# Patient Record
Sex: Male | Born: 1995 | Race: White | Hispanic: No | Marital: Single | Smoking: Current every day smoker
Health system: Southern US, Community
[De-identification: ages and names within clinical notes are randomized; demographics above are authoritative.]

## PROBLEM LIST (undated history)

## (undated) DIAGNOSIS — R011 Cardiac murmur, unspecified: Secondary | ICD-10-CM

## (undated) DIAGNOSIS — I10 Essential (primary) hypertension: Secondary | ICD-10-CM

## (undated) DIAGNOSIS — F419 Anxiety disorder, unspecified: Secondary | ICD-10-CM

## (undated) DIAGNOSIS — I38 Endocarditis, valve unspecified: Secondary | ICD-10-CM

## (undated) HISTORY — PX: LEG SURGERY: SHX1003

---
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
## 2016-06-07 ENCOUNTER — Emergency Department (HOSPITAL_COMMUNITY)
Admission: EM | Admit: 2016-06-07 | Discharge: 2016-06-07 | Disposition: A | Discharge status: Home / Self Care | Payer: Self-pay | Attending: Emergency Medicine | Admitting: Emergency Medicine

## 2016-06-07 ENCOUNTER — Encounter (HOSPITAL_COMMUNITY): Payer: Self-pay | Admitting: *Deleted

## 2016-06-07 DIAGNOSIS — IMO0002 Reserved for concepts with insufficient information to code with codable children: Secondary | ICD-10-CM

## 2016-06-07 DIAGNOSIS — Y939 Activity, unspecified: Secondary | ICD-10-CM | POA: Insufficient documentation

## 2016-06-07 DIAGNOSIS — F17210 Nicotine dependence, cigarettes, uncomplicated: Secondary | ICD-10-CM | POA: Insufficient documentation

## 2016-06-07 DIAGNOSIS — W268XXA Contact with other sharp object(s), not elsewhere classified, initial encounter: Secondary | ICD-10-CM | POA: Insufficient documentation

## 2016-06-07 DIAGNOSIS — S51812A Laceration without foreign body of left forearm, initial encounter: Secondary | ICD-10-CM | POA: Insufficient documentation

## 2016-06-07 DIAGNOSIS — Y929 Unspecified place or not applicable: Secondary | ICD-10-CM | POA: Insufficient documentation

## 2016-06-07 DIAGNOSIS — Y999 Unspecified external cause status: Secondary | ICD-10-CM | POA: Insufficient documentation

## 2016-06-07 DIAGNOSIS — F1290 Cannabis use, unspecified, uncomplicated: Secondary | ICD-10-CM

## 2016-06-07 DIAGNOSIS — X838XXA Intentional self-harm by other specified means, initial encounter: Secondary | ICD-10-CM

## 2016-06-07 LAB — CBC WITH DIFFERENTIAL/PLATELET
BASOS ABS: 0 10*3/uL (ref 0.0–0.1)
BASOS PCT: 0 %
EOS PCT: 1 %
Eosinophils Absolute: 0.1 10*3/uL (ref 0.0–0.7)
HCT: 41 % (ref 39.0–52.0)
Hemoglobin: 14.9 g/dL (ref 13.0–17.0)
Lymphocytes Relative: 31 %
Lymphs Abs: 2.7 10*3/uL (ref 0.7–4.0)
MCH: 29.3 pg (ref 26.0–34.0)
MCHC: 36.3 g/dL — ABNORMAL HIGH (ref 30.0–36.0)
MCV: 80.7 fL (ref 78.0–100.0)
MONO ABS: 0.5 10*3/uL (ref 0.1–1.0)
Monocytes Relative: 6 %
NEUTROS ABS: 5.4 10*3/uL (ref 1.7–7.7)
Neutrophils Relative %: 62 %
PLATELETS: 246 10*3/uL (ref 150–400)
RBC: 5.08 MIL/uL (ref 4.22–5.81)
RDW: 11.7 % (ref 11.5–15.5)
WBC: 8.7 10*3/uL (ref 4.0–10.5)

## 2016-06-07 LAB — RAPID URINE DRUG SCREEN, HOSP PERFORMED
Amphetamines: NOT DETECTED
BENZODIAZEPINES: NOT DETECTED
Barbiturates: NOT DETECTED
COCAINE: NOT DETECTED
Opiates: NOT DETECTED
Tetrahydrocannabinol: POSITIVE — AB

## 2016-06-07 LAB — BASIC METABOLIC PANEL
Anion gap: 7 (ref 5–15)
BUN: 11 mg/dL (ref 6–20)
CHLORIDE: 107 mmol/L (ref 101–111)
CO2: 24 mmol/L (ref 22–32)
Calcium: 9.2 mg/dL (ref 8.9–10.3)
Creatinine, Ser: 0.85 mg/dL (ref 0.61–1.24)
GFR calc Af Amer: >60 mL/min (ref >60)
GFR calc non Af Amer: >60 mL/min (ref >60)
GLUCOSE: 91 mg/dL (ref 65–99)
POTASSIUM: 4.1 mmol/L (ref 3.5–5.1)
Sodium: 138 mmol/L (ref 135–145)

## 2016-06-07 LAB — ETHANOL: ALCOHOL ETHYL (B): 27 mg/dL — AB (ref <5)

## 2016-06-07 MED ORDER — LIDOCAINE-EPINEPHRINE 1 %-1:100000 IJ SOLN: 20.0000 mL | Freq: Once | INTRAMUSCULAR | Status: DC

## 2016-06-07 MED ORDER — LIDOCAINE-EPINEPHRINE (PF) 1 %-1:200000 IJ SOLN
INTRAMUSCULAR | Status: AC
  Administered 2016-06-07: 20 mL
  Filled 2016-06-07: qty 30

## 2016-06-07 MED ORDER — LIDOCAINE-EPINEPHRINE-TETRACAINE (LET) SOLUTION
3.0000 mL | Freq: Once | NASAL | Status: AC
  Administered 2016-06-07: 3 mL via TOPICAL
  Filled 2016-06-07: qty 3

## 2016-06-07 MED ORDER — BACITRACIN ZINC 500 UNIT/GM EX OINT
TOPICAL_OINTMENT | Freq: Once | CUTANEOUS | Status: AC
  Administered 2016-06-07: 1 via TOPICAL
  Filled 2016-06-07: qty 0.9

## 2016-06-07 MED ORDER — BACITRACIN ZINC 500 UNIT/GM EX OINT: 1.0000 "application " | TOPICAL_OINTMENT | Freq: Once | CUTANEOUS | Status: DC

## 2016-06-07 NOTE — ED Notes (Signed)
PT DISCHARGED. INSTRUCTIONS GIVEN. AAOX4. PT IN NO APPARENT DISTRESS OR PAIN. THE OPPORTUNITY TO ASK QUESTIONS WAS PROVIDED. 

## 2016-06-07 NOTE — ED Provider Notes (Signed)
CSN: [id number]     Arrival date & time 06/07/16  1844 History  By signing my name below, I, Alyssa GroveMartin Green, attest that this documentation has been prepared under the direction and in the presence of [employer] Dauna Ziska, PA. Electronically Signed: Alyssa GroveMartin Green, ED Scribe. 06/07/2016. 7:00 PM.   Chief Complaint  Patient presents with  . Extremity Laceration   The history is provided by the patient, the police and the spouse. No language interpreter was used.    HPI Comments: Howard Hayes is a 20 y.o. male who presents to the Emergency Department complaining of 3 deep lacerations to interior left forearm s/p self injury PTA . Pt states he cut himself 3 times with a box cutter. Bleeding is controlled with pressure bandage. Pt has hx of cutting, but has never inflicted wounds this severe. Pt states he was "mad and frustrated". Pt states he usually punches things, but this time resorted to the box cutter. Pt denies suicidal ideations. Pt's fiance does not believe he has suicidal ideations either. Per police, fiance states pt has attempted suicide before by overdose. Pt states, "if I was trying to kill myself, I wouldn't be here right now. I could not be convinced to come here (Emergency Department)." Pt reports tetanus is UTD  Discussed with police and with the patient's girlfriend's mother who states that she was upset, he went into her house, he made cuts to his arms, he did not verbalize that he wanted to kill himself, he got into a truck to leave and they apparently tried to stop them, they called 911 and mother states that she was very concerned that he was trying to commit suicide of note, the mother states that her child's father committed suicide in front of her and this is triggering her PTSD and she is upset about this.   Past Medical History  Diagnosis Date  . Heart murmur    History reviewed. No pertinent past surgical history. No family history on file. Social History  Substance Use Topics   . Smoking status: Current Every Day Smoker    Types: Cigarettes  . Smokeless tobacco: None  . Alcohol Use: No    Review of Systems A complete 10 system review of systems was obtained and all systems are negative except as noted in the HPI and PMH.   Allergies  Review of patient's allergies indicates no known allergies.  Home Medications   Prior to Admission medications   Not on File   BP 140/74 mmHg  Pulse 81  Temp(Src) 98 F (36.7 C) (Oral)  Resp 18  SpO2 95% Physical Exam  Constitutional: He is oriented to person, place, and time. He appears well-developed and well-nourished. No distress.  HENT:  Head: Normocephalic and atraumatic.  Mouth/Throat: Oropharynx is clear and moist.  Eyes: Conjunctivae and EOM are normal. Pupils are equal, round, and reactive to light.  Neck: Normal range of motion.  Cardiovascular: Normal rate, regular rhythm and intact distal pulses.   Pulmonary/Chest: Effort normal and breath sounds normal.  Abdominal: Soft. There is no tenderness.  Musculoskeletal: Normal range of motion.  Neurological: He is alert and oriented to person, place, and time.  Skin: He is not diaphoretic.     Psychiatric: He has a normal mood and affect. He expresses no homicidal and no suicidal ideation.  3x 6 cm partial to full-thickness lacerations to volar aspect of left forearm  Nursing note and vitals reviewed.   ED Course  .Marland Kitchen.Laceration Repair Date/Time: 06/07/2016 9:26  PM Performed by: Wynetta EmeryPISCIOTTA, Shreya Lacasse Authorized by: Wynetta EmeryPISCIOTTA, Raden Byington Consent: Verbal consent obtained. Risks and benefits: risks, benefits and alternatives were discussed Consent given by: patient Patient identity confirmed: verbally with patient Body area: upper extremity Location details: left lower arm Laceration length: 6 cm Foreign bodies: no foreign bodies Tendon involvement: none Nerve involvement: none Vascular damage: no Anesthesia: local infiltration Local anesthetic: lidocaine 2%  without epinephrine Anesthetic total: 5 ml Preparation: Patient was prepped and draped in the usual sterile fashion. Irrigation solution: saline Irrigation method: syringe Amount of cleaning: standard Debridement: none Skin closure: Ethilon (5-0) Number of sutures: 5 Technique: running Approximation: close Approximation difficulty: complex Dressing: antibiotic ointment and 4x4 sterile gauze Patient tolerance: Patient tolerated the procedure well with no immediate complications  .Marland Kitchen.Laceration Repair Date/Time: 06/07/2016 9:26 PM Performed by: Wynetta EmeryPISCIOTTA, Jadore Veals Authorized by: Wynetta EmeryPISCIOTTA, Luz Burcher Consent: Verbal consent obtained. Risks and benefits: risks, benefits and alternatives were discussed Consent given by: patient Patient identity confirmed: verbally with patient Body area: upper extremity Location details: left lower arm Laceration length: 6 cm Foreign bodies: no foreign bodies Tendon involvement: none Nerve involvement: none Vascular damage: no Anesthesia: local infiltration Local anesthetic: lidocaine 2% without epinephrine Anesthetic total: 5 ml Preparation: Patient was prepped and draped in the usual sterile fashion. Irrigation solution: saline Irrigation method: syringe Amount of cleaning: standard Debridement: none Skin closure: Ethilon (5-0) Number of sutures: 5 Technique: running Approximation: close Approximation difficulty: complex Dressing: antibiotic ointment and 4x4 sterile gauze Patient tolerance: Patient tolerated the procedure well with no immediate complications  DIAGNOSTIC STUDIES: Oxygen Saturation is 95% on RA, adequateby my interpretation.     COORDINATION OF CARE: 7:00 PM Discussed treatment plan with pt at bedside which includes lidocaine and pt agreed to plan.  Labs Review Labs Reviewed  ETHANOL  CBC WITH DIFFERENTIAL/PLATELET  URINE RAPID DRUG SCREEN, HOSP PERFORMED  BASIC METABOLIC PANEL    Imaging Review No results found. I  have personally reviewed and evaluated these images and lab results as part of my medical decision-making.   EKG Interpretation None      MDM   Final diagnoses:  Laceration  Self-harm, initial encounter    Filed Vitals:   06/07/16 1856  BP: 140/74  Pulse: 81  Temp: 98 F (36.7 C)  TempSrc: Oral  Resp: 18  SpO2: 95%    Medications  lidocaine-EPINEPHrine-tetracaine (LET) solution (3 mLs Topical Given 06/07/16 2007)  lidocaine-EPINEPHrine (XYLOCAINE-EPINEPHrine) 1 %-1:200000 (PF) injection (20 mLs  Given by Other 06/07/16 2008)    Howard Hayes is 20 y.o. male presenting with Self-inflicted cuts to left forearm, these are shallow. Patient denies suicidal ideation, he has a history of cutting however, he also has a history of suicide attempt by overdose in the remote past. Patient and his girlfriend both deny that he was suicidal, patient states that he wanted to feel pain after getting into an argument and being upset, this is consistent with cutting behaviors rather than a suicide attempt. I don't think this patient needs to be involuntarily committed. TTS is consulted, they have evaluated the patient but they will need full blood work and urine drug screen before they make their recommendations. Wound is cleaned and closed.   Discussed case with attending physician who agrees with care plan and disposition.   TTS is evaluated the patient but will not make recommendations until they have blood work and urine back, case signed out to PA Camprubi-Soms at shift change: Plan is to follow-up results of blood work and urinalysis,  medically clear the patient and make sure that TTS agrees he is safe to discharge to home.  I personally performed the services described in this documentation, which was scribed in my presence. The recorded information has been reviewed and is accurate.    Wynetta Emery, PA-C 06/07/16 2134  Wynetta Emery, PA-C 06/07/16 1610  Leta Baptist,  MD 06/10/16 [phone]

## 2016-06-07 NOTE — ED Provider Notes (Signed)
Care assumed from [facility] Partnership[facility]ole Pisciotta, PA-C, at shift change. Pt here with wrist lac, admitted that this was a cutting behavior, denies SI/HI/AVH. Due to self-inflicted wounds, TTS was consulted but won't make recommendations until he had med clearance labs, so EtOH/UDS/CBC/BMP were ordered, which are being collected now. Plan is to likely d/c assuming TTS agreed with him being safe for d/c; or if they feel he needs IP tx then would need to likely IVC him to get him to stay. His d/c summary has been printed assuming he is able to go home, with instructions for management of his lacerations which were repaired by Joni ReiningNicole.  Physical Exam  BP 140/74 mmHg  Pulse 81  Temp(Src) 98 F (36.7 C) (Oral)  Resp 18  SpO2 95%  Physical Exam Gen: afebrile, VSS, NAD HEENT: EOMI, MMM Resp: no resp distress CV: rate WNL Abd: appearance normal, nondistended MsK: moving all extremities with ease, steady gait Neuro: A&O x4, denies SI/HI/AVH  ED Course  Procedures Results for orders placed or performed during the hospital encounter of 06/07/16  Ethanol  Result Value Ref Range   Alcohol, Ethyl (B) 27 (H) <5 mg/dL  CBC with Diff  Result Value Ref Range   WBC 8.7 4.0 - 10.5 K/uL   RBC 5.08 4.22 - 5.81 MIL/uL   Hemoglobin 14.9 13.0 - 17.0 g/dL   HCT 60.441.0 54.039.0 - 98.152.0 %   MCV 80.7 78.0 - 100.0 fL   MCH 29.3 26.0 - 34.0 pg   MCHC 36.3 (H) 30.0 - 36.0 g/dL   RDW 19.111.7 47.811.5 - 29.515.5 %   Platelets 246 150 - 400 K/uL   Neutrophils Relative % 62 %   Neutro Abs 5.4 1.7 - 7.7 K/uL   Lymphocytes Relative 31 %   Lymphs Abs 2.7 0.7 - 4.0 K/uL   Monocytes Relative 6 %   Monocytes Absolute 0.5 0.1 - 1.0 K/uL   Eosinophils Relative 1 %   Eosinophils Absolute 0.1 0.0 - 0.7 K/uL   Basophils Relative 0 %   Basophils Absolute 0.0 0.0 - 0.1 K/uL  Urine rapid drug screen (hosp performed)[facility]  Result Value Ref Range   Opiates NONE DETECTED NONE DETECTED   Cocaine NONE DETECTED NONE DETECTED   Benzodiazepines NONE  DETECTED NONE DETECTED   Amphetamines NONE DETECTED NONE DETECTED   Tetrahydrocannabinol POSITIVE (A) NONE DETECTED   Barbiturates NONE DETECTED NONE DETECTED  Basic metabolic panel  Result Value Ref Range   Sodium 138 135 - 145 mmol/L   Potassium 4.1 3.5 - 5.1 mmol/L   Chloride 107 101 - 111 mmol/L   CO2 24 22 - 32 mmol/L   Glucose, Bld 91 65 - 99 mg/dL   BUN 11 6 - 20 mg/dL   Creatinine, Ser 6.210.85 0.61 - 1.24 mg/dL   Calcium 9.2 8.9 - 30.810.3 mg/dL   GFR calc non Af Amer >60 >60 mL/min   GFR calc Af Amer >60 >60 mL/min   Anion gap 7 5 - 15   No results found.   Meds ordered this encounter  Medications  . lidocaine-EPINEPHrine-tetracaine (LET) solution    Sig:   . DISCONTD: lidocaine-EPINEPHrine (XYLOCAINE W/EPI) 1 %-1:100000 (with pres) injection 20 mL    Sig:   . lidocaine-EPINEPHrine (XYLOCAINE-EPINEPHrine) 1 %-1:200000 (PF) injection    Sig:     Launa Flightavy, Adrian   : cabinet override  . DISCONTD: bacitracin ointment 1 application    Sig:   . bacitracin ointment    Sig:  MDM:   ICD-9-CM ICD-10-CM   1. Laceration 879.8 T14.8   2. Self-harm, initial encounter E958.9 [id number].[id number]   3. Marijuana use 305.20 F12.10      10:15 PM Labs returning showing EtOH 27 (below legal limit); CBC w/diff and BMP WNL. UDS with +THC but otherwise unremarkable. Pt medically cleared, will await TTS recommendations; told TTS that he is medically cleared and I'm waiting on their recommendations.   10:25 PM Jovea (CSW with TTS) calling me back, stated that she will give recommendations shortly, but will call me back with them. Will await her recommendations  10:42 PM TTS returning to room, gave him outpt resources, stated he is safe for d/c and doesn't need any further inpatient monitoring/management of his self-cutting behaviors. Wound care discussed. F/up with [facility] or PCP in 7-10 days for wound recheck and suture removal. I explained the diagnosis and have given explicit precautions to return to  the ER including for any other new or worsening symptoms. The patient understands and accepts the medical plan as it's been dictated and I have answered their questions. Discharge instructions concerning home care and prescriptions have been given. The patient is STABLE and is discharged to home in good condition.   BP 140/74 mmHg  Pulse 81  Temp(Src) 98 F (36.7 C) (Oral)  Resp 18  SpO2 95%   Demontre Padin Camprubi-Soms, PA-C 06/07/16 2243  Leta BaptistEmily Roe Nguyen, MD 06/10/16 1722

## 2016-06-07 NOTE — BH Assessment (Signed)
Assessment completed. Consulted with Maryjean Mornharles Kober, PA-C who recommends patient be discharged with outpatient resources.   Davina PokeJoVea Ellen Mayol, LCSW Therapeutic Triage Specialist [facility] 06/07/2016 8:08 PM

## 2016-06-07 NOTE — Discharge Instructions (Signed)
Keep wound dry and do not remove dressing for 24 hours if possible. After that, wash gently morning and night (every 12 hours) with soap and water. Use a topical antibiotic ointment and cover with a bandaid or gauze.  °  °Do NOT use rubbing alcohol or hydrogen peroxide, do not soak the area °  °Present to your primary care doctor or the urgent care of your choice, or the ED for suture removal in 8-10 days. °  °Every attempt was made to remove foreign body (contaminants) from the wound.  However, there is always a chance that some may remain in the wound. This can  increase your risk of infection. °  °If you see signs of infection (warmth, redness, tenderness, pus, sharp increase in pain, fever, red streaking in the skin) immediately return to the emergency department. °  °After the wound heals fully, apply sunscreen for 6-12 months to minimize scarring.  ° ° ° °

## 2016-06-07 NOTE — BH Assessment (Addendum)
Assessment Note  Howard Hayes is an 20 y.o. male presenting to [facility] voluntarily with lacerations to his left arm. Patient states that today he and his fiance got into an argument and he beccame upset and 'did something stupid and let anger get the best of me and took a box cutter to my arm three times." patient denies that this was a suicide attempt. Patient reports that he previously self injured "about five years ago" "almost daily" but this is the first time he has cut himself since that time. Patient states that he has one previous suicide attempt about five years ago by attempting to hang himself due to "depression and my parents divorce." Patient denies HI and history of aggression. Patient denies access to firearms. Patient denies legal involvement.  Patient denies AVH and does not appear to be responding to internal stimuli. Patient states that he was treated for alcohol poisoning about five years ago in an inpatient setting but denies history of alcohol abuse. Patient reports that he was celebrating someone's birthday and drank "way too much."  He states that he was referred to outpatient treatment for substance abuse once released. Patient reports that he drank four beers today.  Patient denies any other inpatient or outpatient treatment. Patient denies being depressed when asked and denies symptoms of depression as well. Patient contracts for safety at time of assessment. Patient BAL 27 and UDS + THC.   Consulted with Maryjean Mornharles Kober, PA-C who recommends discharging patient with outpatient resources.   Diagnosis: Substance Induced Mood Disorder   Past Medical History:  Past Medical History  Diagnosis Date  . Heart murmur     History reviewed. No pertinent past surgical history.  Family History: No family history on file.  Social History:  reports that he has been smoking Cigarettes.  He does not have any smokeless tobacco history on file. He reports that he does not drink alcohol or use  illicit drugs.  Additional Social History:  Alcohol / Drug Use Pain Medications: Denies Prescriptions: Denies  Over the Counter: Denies History of alcohol / drug use?: No history of alcohol / drug abuse  CIWA: CIWA-Ar BP: 140/74 mmHg Pulse Rate: 81 COWS:    Allergies: No Known Allergies  Home Medications:  (Not in a hospital admission)  OB/GYN Status:  No LMP for male patient.  General Assessment Data Location of Assessment: [facility] TTS Assessment: In system Is this a Tele or Face-to-Face Assessment?: Face-to-Face Is this an Initial Assessment or a Re-assessment for this encounter?: Initial Assessment Marital status: Single Is patient pregnant?: No Pregnancy Status: No Living Arrangements: Spouse/significant other (fiancee and mother of fiance) Can pt return to current living arrangement?: Yes Admission Status: Voluntary Is patient capable of signing voluntary admission?: Yes Referral Source: Self/Family/Friend     Crisis Care Plan Living Arrangements: Spouse/significant other (fiancee and mother of fiance) Name of Psychiatrist: None Name of Therapist: None  Education Status Is patient currently in school?: No Highest grade of school patient has completed: 11th  Risk to self with the past 6 months Suicidal Ideation: No Has patient been a risk to self within the past 6 months prior to admission? : No Suicidal Intent: No Has patient had any suicidal intent within the past 6 months prior to admission? : No Is patient at risk for suicide?: No Suicidal Plan?: No Has patient had any suicidal plan within the past 6 months prior to admission? : No Access to Means: No What has been your use  of drugs/alcohol within the last 12 months?: Alcohol  Previous Attempts/Gestures: Yes How many times?: 1 (2012 by hanging) Other Self Harm Risks: Denies Triggers for Past Attempts: Other (Comment) (depression) Intentional Self Injurious Behavior: Cutting Comment - Self Injurious  Behavior: history of cutting five years ago  (cut "almost daily five years ago" cut "depressed and upset") Family Suicide History: No Recent stressful life event(s): Financial Problems Persecutory voices/beliefs?: No Depression: No (denies) Depression Symptoms:  (denies symptoms) Substance abuse history and/or treatment for substance abuse?: Yes Suicide prevention information given to non-admitted patients: Not applicable  Risk to Others within the past 6 months Homicidal Ideation: No Does patient have any lifetime risk of violence toward others beyond the six months prior to admission? : No Thoughts of Harm to Others: No Current Homicidal Intent: No Current Homicidal Plan: No Access to Homicidal Means: No Identified Victim: Denies History of harm to others?: No Assessment of Violence: None Noted Violent Behavior Description: Denies Does patient have access to weapons?: No Criminal Charges Pending?: No Does patient have a court date: No Is patient on probation?: No  Psychosis Hallucinations: None noted Delusions: None noted  Mental Status Report Appearance/Hygiene: Unremarkable Eye Contact: Good Motor Activity: Unable to assess Speech: Logical/coherent Level of Consciousness: Alert Mood: Pleasant Affect: Appropriate to circumstance Anxiety Level: None Thought Processes: Coherent, Relevant Judgement: Unimpaired Orientation: Person, Place, Time, Situation Obsessive Compulsive Thoughts/Behaviors: None  Cognitive Functioning Concentration: Good Memory: Recent Intact, Remote Intact IQ: Average Insight: Good Impulse Control: Good Appetite: Good Sleep: No Change Total Hours of Sleep: 8 Vegetative Symptoms: None  ADLScreening [facility] Assessment Services) Patient's cognitive ability adequate to safely complete daily activities?: Yes Patient able to express need for assistance with ADLs?: Yes Independently performs ADLs?: Yes (appropriate for developmental age)  Prior  Inpatient Therapy Prior Inpatient Therapy: Yes Prior Therapy Dates: 2013 Prior Therapy Facilty/Provider(s): [facility] Reason for Treatment: Alcohol Poisoning  Prior Outpatient Therapy Prior Outpatient Therapy: Yes Prior Therapy Dates: 2014 Prior Therapy Facilty/Provider(s): [facility] Reason for Treatment: SA Does patient have an ACCT team?: No Does patient have Intensive In-House Services?  : No Does patient have [facility] services? : No Does patient have P4CC services?: No  ADL Screening (condition at time of admission) Patient's cognitive ability adequate to safely complete daily activities?: Yes Is the patient deaf or have difficulty hearing?: No Does the patient have difficulty seeing, even when wearing glasses/contacts?: No Does the patient have difficulty concentrating, remembering, or making decisions?: No Patient able to express need for assistance with ADLs?: Yes Does the patient have difficulty dressing or bathing?: No Independently performs ADLs?: Yes (appropriate for developmental age) Does the patient have difficulty walking or climbing stairs?: No Weakness of Legs: None Weakness of Arms/Hands: None  Home Assistive Devices/Equipment Home Assistive Devices/Equipment: None  Therapy Consults (therapy consults require a physician order) PT Evaluation Needed: No OT Evalulation Needed: No SLP Evaluation Needed: No Abuse/Neglect Assessment (Assessment to be complete while patient is alone) Physical Abuse: Denies Verbal Abuse: Denies Sexual Abuse: Denies Exploitation of patient/patient's resources: Denies Self-Neglect: Denies Values / Beliefs Cultural Requests During Hospitalization: None Spiritual Requests During Hospitalization: None Consults Spiritual Care Consult Needed: No Social Work Consult Needed: No Merchant navy officer (For Healthcare) Does patient have an advance directive?: No Would patient like information on creating an advanced directive?: No - patient declined  information    Additional Information 1:1 In Past 12 Months?: No CIRT Risk: No Elopement Risk: No Does patient have medical clearance?: No  Disposition:  Disposition Initial Assessment Completed for this Encounter: Yes  On Site Evaluation by:   Reviewed with Physician:    Detrice Cales 06/07/2016 8:00 PM

## 2016-06-07 NOTE — ED Notes (Addendum)
Pt here for laceration to left forearm after cutting self with box cutter today. Pt also has abrasions to right knuckles from hitting the ground. Pt denies suicidal ideation, states "I wanted to feel pain" after getting into argument with fiance. Pt states he is no longer angry. Bleeding controlled by EMS.

## 2016-06-07 NOTE — BH Assessment (Signed)
Patient received outpatient resources and denies any questions or concerns. EDPA informed of disposition and is in agreement.   Davina PokeJoVea Natasia Sanko, LCSW Therapeutic Triage Specialist [facility] 06/07/2016 11:12 PM

## 2016-09-01 ENCOUNTER — Encounter (HOSPITAL_COMMUNITY): Payer: Self-pay | Admitting: Emergency Medicine

## 2016-09-01 ENCOUNTER — Emergency Department (HOSPITAL_COMMUNITY)
Admission: EM | Admit: 2016-09-01 | Discharge: 2016-09-01 | Disposition: A | Discharge status: Home / Self Care | Payer: Self-pay | Attending: Emergency Medicine | Admitting: Emergency Medicine

## 2016-09-01 ENCOUNTER — Emergency Department (HOSPITAL_COMMUNITY): Payer: Self-pay

## 2016-09-01 DIAGNOSIS — W230XXA Caught, crushed, jammed, or pinched between moving objects, initial encounter: Secondary | ICD-10-CM | POA: Insufficient documentation

## 2016-09-01 DIAGNOSIS — Y999 Unspecified external cause status: Secondary | ICD-10-CM | POA: Insufficient documentation

## 2016-09-01 DIAGNOSIS — I10 Essential (primary) hypertension: Secondary | ICD-10-CM | POA: Insufficient documentation

## 2016-09-01 DIAGNOSIS — Y939 Activity, unspecified: Secondary | ICD-10-CM | POA: Insufficient documentation

## 2016-09-01 DIAGNOSIS — S60221A Contusion of right hand, initial encounter: Secondary | ICD-10-CM | POA: Insufficient documentation

## 2016-09-01 DIAGNOSIS — F17210 Nicotine dependence, cigarettes, uncomplicated: Secondary | ICD-10-CM | POA: Insufficient documentation

## 2016-09-01 DIAGNOSIS — Y929 Unspecified place or not applicable: Secondary | ICD-10-CM | POA: Insufficient documentation

## 2016-09-01 HISTORY — DX: Essential (primary) hypertension: I10

## 2016-09-01 MED ORDER — TRAMADOL HCL 50 MG PO TABS: 50.0000 mg | ORAL_TABLET | Freq: Four times a day (QID) | ORAL | 0 refills | Status: DC | PRN

## 2016-09-01 MED ORDER — HYDROCODONE-ACETAMINOPHEN 5-325 MG PO TABS
1.0000 | ORAL_TABLET | Freq: Once | ORAL | Status: AC
  Administered 2016-09-01: 1 via ORAL
  Filled 2016-09-01: qty 1

## 2016-09-01 NOTE — ED Provider Notes (Signed)
[facility] Provider Note   CSN: [id number] Arrival date & time: 09/01/16  1613   By signing my name below, I, Christel Mormon, attest that this documentation has been prepared under the direction and in the presence of Kerrie Buffalo, NP. Electronically Signed: Christel Mormon, Scribe. 09/01/2016. 4:57 PM.   History   Chief Complaint Chief Complaint  Patient presents with  . Hand Pain    The history is provided by the patient. No language interpreter was used.  Hand Pain  This is a new problem. The current episode started 6 to 12 hours ago. The problem occurs constantly. The problem has not changed since onset.  HPI Comments:  Howard Hayes is a 20 y.o. male with PMHx of HTN who presents to the Emergency Department s/p an injury to his R hand that occurred today. Pt reports that his R hand was shut in a car door this morning. Pt rates the pain at 7/10. Patient is left hand dominant.   Past Medical History:  Diagnosis Date  . Heart murmur   . Hypertension     There are no active problems to display for this patient.   History reviewed. No pertinent surgical history.     Home Medications    Prior to Admission medications   Medication Sig Start Date End Date Taking? Authorizing Provider  traMADol (ULTRAM) 50 MG tablet Take 1 tablet (50 mg total) by mouth every 6 (six) hours as needed. 09/01/16   Author [place] Orlene Och, NP    Family History History reviewed. No pertinent family history.  Social History Social History  Substance Use Topics  . Smoking status: Current Every Day Smoker    Types: Cigarettes  . Smokeless tobacco: Never Used  . Alcohol use No     Allergies   Review of patient's allergies indicates no known allergies.   Review of Systems Review of Systems  Musculoskeletal: Positive for arthralgias.       Right hand pain  All other systems reviewed and are negative.    Physical Exam Updated Vital Signs BP 150/84 (BP Location: Left Arm)   Pulse 82    Temp 98.1 F (36.7 C) (Oral)   Resp 18   Wt 82.6 kg   SpO2 98%   Physical Exam  Constitutional: He appears well-developed and well-nourished. No distress.  HENT:  Head: Normocephalic and atraumatic.  Eyes: Conjunctivae are normal.  Cardiovascular: Normal rate.   Radial pulses 2+   Pulmonary/Chest: Effort normal.  Abdominal: He exhibits no distension.  Musculoskeletal:  Finger-thumb opposition with no difficulty. Tenderness with ROM of 4th and 5th digits of the R hand. Swelling, ecchymosis, and tenderness to dorsum of R hand over the 4th and 5th metacarpals.   Neurological: He is alert.  Skin: Skin is warm and dry.  Psychiatric: He has a normal mood and affect.  Nursing note and vitals reviewed.    ED Treatments / Results  DIAGNOSTIC STUDIES:  Oxygen Saturation is 98% on RA, normal by my interpretation.    COORDINATION OF CARE:  4:57 PM Will give splint and ace wrap. Discussed treatment plan with pt at bedside and pt agreed to plan.   Labs (all labs ordered are listed, but only abnormal results are displayed) Labs Reviewed - No data to display  Radiology Dg Hand Complete Right  Result Date: 09/01/2016 CLINICAL DATA:  Right hand slammed in a door. Pain. Pain is posterior. EXAM: RIGHT HAND - COMPLETE 3+ VIEW COMPARISON:  None. FINDINGS: There is no  evidence of fracture or dislocation. There is no evidence of arthropathy or other focal bone abnormality. Soft tissues are unremarkable. IMPRESSION: No acute osseous injury of the right hand. Electronically Signed   By: Elige KoHetal  Patel   On: 09/01/2016 17:01    Procedures Procedures (including critical care time)  Medications Ordered in ED Medications  HYDROcodone-acetaminophen (NORCO/VICODIN) 5-325 MG per tablet 1 tablet (1 tablet Oral Given 09/01/16 1702)     Initial Impression / Assessment and Plan / ED Course  Provided splint and ace wrap for R hand contusion.   I have reviewed the triage vital signs and the nursing  notes.  Pertinent imaging results that were available during my care of the patient were reviewed by me and considered in my medical decision making (see chart for details).  Clinical Course    Final Clinical Impressions(s) / ED Diagnoses  20 y.o. male with pain and swelling to the right hand s/p injury stable for d/c without focal neuro deficits and no fracture or dislocation noted on x-ray. Splint applied, pain management and f/u if symptoms worsen.  Final diagnoses:  Hand contusion, right, initial encounter    New Prescriptions Discharge Medication List as of 09/01/2016  5:33 PM    START taking these medications   Details  traMADol (ULTRAM) 50 MG tablet Take 1 tablet (50 mg total) by mouth every 6 (six) hours as needed., Starting Wed 09/01/2016, Print      I personally performed the services described in this documentation, which was scribed in my presence. The recorded information has been reviewed and is accurate.     [address]Ethelyn Cerniglia [place] Vineeth Fell, NP 09/03/16 0154    Maia PlanJoshua G Long, MD 09/03/16 1009

## 2016-09-01 NOTE — Discharge Instructions (Signed)
Your may take ibuprofen in addition to the pain medication. Do not drive while taking the pain medication as it will make you sleepy. Follow up with Dr. Mina MarbleWeingold if symptoms worsen.

## 2016-09-01 NOTE — ED Triage Notes (Signed)
Pt presents to ED for assessment of right hand pain after shutting it in the car door.  Redness and swelling noted.  Full neuro intact, pulses strong.

## 2016-09-01 NOTE — Progress Notes (Signed)
Orthopedic Tech Progress Note Patient Details:  Howard Hayes 08/25/1996 [id number]  Ortho Devices Type of Ortho Device: Buddy tape, Finger splint Ortho Device/Splint Location: RUE Ortho Device/Splint Interventions: Ordered, Application   Jennye MoccasinHughes, Aubreyana Saltz Craig 09/01/2016, 5:24 PM

## 2016-10-02 ENCOUNTER — Emergency Department (HOSPITAL_COMMUNITY)
Admission: EM | Admit: 2016-10-02 | Discharge: 2016-10-02 | Disposition: A | Discharge status: Home / Self Care | Payer: Self-pay | Attending: Emergency Medicine | Admitting: Emergency Medicine

## 2016-10-02 ENCOUNTER — Encounter (HOSPITAL_COMMUNITY): Payer: Self-pay | Admitting: *Deleted

## 2016-10-02 DIAGNOSIS — R42 Dizziness and giddiness: Secondary | ICD-10-CM | POA: Insufficient documentation

## 2016-10-02 DIAGNOSIS — I10 Essential (primary) hypertension: Secondary | ICD-10-CM | POA: Insufficient documentation

## 2016-10-02 DIAGNOSIS — F17210 Nicotine dependence, cigarettes, uncomplicated: Secondary | ICD-10-CM | POA: Insufficient documentation

## 2016-10-02 HISTORY — DX: Endocarditis, valve unspecified: I38

## 2016-10-02 LAB — CBC
HEMATOCRIT: 46 % (ref 39.0–52.0)
HEMOGLOBIN: 16 g/dL (ref 13.0–17.0)
MCH: 29.7 pg (ref 26.0–34.0)
MCHC: 34.8 g/dL (ref 30.0–36.0)
MCV: 85.3 fL (ref 78.0–100.0)
Platelets: 199 10*3/uL (ref 150–400)
RBC: 5.39 MIL/uL (ref 4.22–5.81)
RDW: 12.7 % (ref 11.5–15.5)
WBC: 6.6 10*3/uL (ref 4.0–10.5)

## 2016-10-02 LAB — BASIC METABOLIC PANEL
ANION GAP: 8 (ref 5–15)
BUN: 10 mg/dL (ref 6–20)
CALCIUM: 9.6 mg/dL (ref 8.9–10.3)
CHLORIDE: 106 mmol/L (ref 101–111)
CO2: 25 mmol/L (ref 22–32)
Creatinine, Ser: 0.87 mg/dL (ref 0.61–1.24)
GFR calc Af Amer: >60 mL/min (ref >60)
GFR calc non Af Amer: >60 mL/min (ref >60)
GLUCOSE: 103 mg/dL — AB (ref 65–99)
POTASSIUM: 4.2 mmol/L (ref 3.5–5.1)
Sodium: 139 mmol/L (ref 135–145)

## 2016-10-02 LAB — CBG MONITORING, ED: Glucose-Capillary: 100 mg/dL — ABNORMAL HIGH (ref 65–99)

## 2016-10-02 MED ORDER — LISINOPRIL 10 MG PO TABS: 10.0000 mg | ORAL_TABLET | Freq: Every day | ORAL | 0 refills | Status: DC

## 2016-10-02 NOTE — ED Triage Notes (Signed)
Patient reports he has had pulsating sensation all over for the past few days that ends up causing a headache.  He has had n/v x 1.  Denies syncope but reports he has had dizziness.  Patient denies trauma.  He is on bp meds for htn.

## 2016-10-02 NOTE — ED Provider Notes (Signed)
[facility] Provider Note   CSN: [id number] Arrival date & time: 10/02/16  1255     History   Chief Complaint Chief Complaint  Patient presents with  . Dizziness  . Headache  . Weakness    states he has pulsating sensation in his body    HPI Howard Hayes is a 20 y.o. male.  Patient is a 20 year old male with past medical history of hypertension. He presents for evaluation of a "pulsing sensation" that occurs in his head and spreads throughout his entire body. This is been occurring on several occasions over the past several days. His symptoms last only seconds, then resolved. He denies any headache or chest pain. He denies any shortness of breath, but does feel slightly dizzy with these episodes. He denies any drug or alcohol use.   The history is provided by the patient.  Dizziness  Quality:  Lightheadedness Severity:  Moderate Onset quality:  Sudden Timing:  Intermittent Progression:  Resolved Chronicity:  Recurrent Relieved by:  Nothing Worsened by:  Nothing Ineffective treatments:  None tried Associated symptoms: headaches and weakness   Headache    Weakness  Primary symptoms include dizziness. Associated symptoms include headaches.    Past Medical History:  Diagnosis Date  . Heart murmur   . Heart valve problem    unsure  . Hypertension     There are no active problems to display for this patient.   History reviewed. No pertinent surgical history.     Home Medications    Prior to Admission medications   Medication Sig Start Date End Date Taking? Authorizing Provider  traMADol (ULTRAM) 50 MG tablet Take 1 tablet (50 mg total) by mouth every 6 (six) hours as needed. 09/01/16   Hope Orlene OchM Neese, NP    Family History No family history on file.  Social History Social History  Substance Use Topics  . Smoking status: Current Every Day Smoker    Types: Cigarettes  . Smokeless tobacco: Never Used  . Alcohol use No     Allergies   Review  of patient's allergies indicates no known allergies.   Review of Systems Review of Systems  Neurological: Positive for dizziness, weakness and headaches.  All other systems reviewed and are negative.    Physical Exam Updated Vital Signs BP 132/75 (BP Location: Right Arm)   Pulse 101   Temp 98.6 F (37 C) (Oral)   Resp 18   Wt 184 lb 8 oz (83.7 kg)   SpO2 96%   Physical Exam  Constitutional: He is oriented to person, place, and time. He appears well-developed and well-nourished. No distress.  HENT:  Head: Normocephalic and atraumatic.  Mouth/Throat: Oropharynx is clear and moist.  Eyes: EOM are normal. Pupils are equal, round, and reactive to light.  Neck: Normal range of motion. Neck supple.  Cardiovascular: Normal rate and regular rhythm.  Exam reveals no friction rub.   No murmur heard. Pulmonary/Chest: Effort normal and breath sounds normal. No respiratory distress. He has no wheezes. He has no rales.  Abdominal: Soft. Bowel sounds are normal. He exhibits no distension. There is no tenderness.  Musculoskeletal: Normal range of motion. He exhibits no edema.  Neurological: He is alert and oriented to person, place, and time. No cranial nerve deficit. He exhibits normal muscle tone. Coordination normal.  Skin: Skin is warm and dry. He is not diaphoretic.  Nursing note and vitals reviewed.    ED Treatments / Results  Labs (all labs ordered are listed,  but only abnormal results are displayed) Labs Reviewed  BASIC METABOLIC PANEL - Abnormal; Notable for the following:       Result Value   Glucose, Bld 103 (*)    All other components within normal limits  CBG MONITORING, ED - Abnormal; Notable for the following:    Glucose-Capillary 100 (*)    All other components within normal limits  CBC  URINALYSIS, ROUTINE W REFLEX MICROSCOPIC (NOT AT [facility])    EKG  EKG Interpretation  Date/Time:  Saturday October 02 2016 13:17:00 EDT Ventricular Rate:  90 PR  Interval:  150 QRS Duration: 90 QT Interval:  332 QTC Calculation: 406 R Axis:   79 Text Interpretation:  Normal sinus rhythm Nonspecific T wave abnormality Abnormal ECG Confirmed by Aya Geisel  MD, Tavyn Kurka ([id number]) on 10/02/2016 2:23:24 PM       Radiology No results found.  Procedures Procedures (including critical care time)  Medications Ordered in ED Medications - No data to display   Initial Impression / Assessment and Plan / ED Course  I have reviewed the triage vital signs and the nursing notes.  Pertinent labs & imaging results that were available during my care of the patient were reviewed by me and considered in my medical decision making (see chart for details).  Clinical Course    Patient is a 20 year old male who presents with episodes of pulsations in his head that spread throughout his entire body. I am uncertain as to what is causing his symptoms, however his EKG and laboratory studies are unremarkable. His neurologic exam is nonfocal and I highly doubt any sort of intracranial abnormality. I've advised the patient to give this situation time. This will either resolve spontaneously, or worsen. If it worsens he is to return so we can reevaluate him. His prescription for lisinopril will be refilled.  Final Clinical Impressions(s) / ED Diagnoses   Final diagnoses:  None    New Prescriptions New Prescriptions   No medications on file     Geoffery Lyonsouglas Harith Mccadden, MD 10/02/16 1502

## 2016-10-02 NOTE — Discharge Instructions (Signed)
Continue your lisinopril as before.  Follow-up with a primary doctor here in the area to establish primary care.  Return to the ER if your symptoms significantly worsen or change.

## 2017-06-19 ENCOUNTER — Emergency Department (HOSPITAL_COMMUNITY)
Admission: EM | Admit: 2017-06-19 | Discharge: 2017-06-19 | Disposition: A | Discharge status: Home / Self Care | Payer: Self-pay | Attending: Emergency Medicine | Admitting: Emergency Medicine

## 2017-06-19 ENCOUNTER — Encounter (HOSPITAL_COMMUNITY): Payer: Self-pay

## 2017-06-19 DIAGNOSIS — I10 Essential (primary) hypertension: Secondary | ICD-10-CM | POA: Insufficient documentation

## 2017-06-19 DIAGNOSIS — J020 Streptococcal pharyngitis: Secondary | ICD-10-CM | POA: Insufficient documentation

## 2017-06-19 DIAGNOSIS — R011 Cardiac murmur, unspecified: Secondary | ICD-10-CM | POA: Insufficient documentation

## 2017-06-19 DIAGNOSIS — J351 Hypertrophy of tonsils: Secondary | ICD-10-CM | POA: Insufficient documentation

## 2017-06-19 DIAGNOSIS — J358 Other chronic diseases of tonsils and adenoids: Secondary | ICD-10-CM

## 2017-06-19 DIAGNOSIS — F17210 Nicotine dependence, cigarettes, uncomplicated: Secondary | ICD-10-CM | POA: Insufficient documentation

## 2017-06-19 LAB — RAPID STREP SCREEN (MED CTR MEBANE ONLY): STREPTOCOCCUS, GROUP A SCREEN (DIRECT): POSITIVE — AB

## 2017-06-19 MED ORDER — DEXAMETHASONE 4 MG PO TABS
10.0000 mg | ORAL_TABLET | Freq: Once | ORAL | Status: AC
  Administered 2017-06-19: 10 mg via ORAL
  Filled 2017-06-19: qty 3

## 2017-06-19 MED ORDER — PENICILLIN G BENZATHINE 1200000 UNIT/2ML IM SUSP
1.2000 10*6.[IU] | Freq: Once | INTRAMUSCULAR | Status: AC
  Administered 2017-06-19: 1.2 10*6.[IU] via INTRAMUSCULAR
  Filled 2017-06-19: qty 2

## 2017-06-19 NOTE — ED Triage Notes (Signed)
Onset yesterday sore throat, neck swelling. No swallowing difficulties. .Marland Kitchen

## 2017-06-19 NOTE — ED Provider Notes (Signed)
[facility]-EMERGENCY DEPT Provider Note   CSN: [id number] Arrival date & time: 06/19/17  1842 By signing my name below, I, Howard HedgerElizabeth Hayes, attest that this documentation has been prepared under the direction and in the presence of non-physician practitioner, Kerrie BuffaloHope Baley Lorimer, NP. Electronically Signed: Levon HedgerElizabeth Hayes, Scribe. 06/19/2017. 8:03 PM.   History   Chief Complaint Chief Complaint  Patient presents with  . Sore Throat   HPI Howard Hayes is a 21 y.o. male who presents to the Emergency Department complaining of constant, 7/10 sore throat onset yesterday. He reports associated chills, lightheadedness, and neck swelling. No OTC treatments tried for these symptoms PTA. No recent sick contacts. He denies any otalgia, vision changes, eye redness, abdominal pain, nausea, vomiting, cough, or difficulty swallowing.   The history is provided by the patient. No language interpreter was used.  Sore Throat  This is a new problem. The current episode started yesterday. The problem occurs constantly. Associated symptoms include headaches. Pertinent negatives include no chest pain, no abdominal pain and no shortness of breath. He has tried nothing for the symptoms.    Past Medical History:  Diagnosis Date  . Heart murmur   . Heart valve problem    unsure  . Hypertension     There are no active problems to display for this patient.   History reviewed. No pertinent surgical history.   Home Medications    Prior to Admission medications   Medication Sig Start Date End Date Taking? Authorizing Provider  lisinopril (PRINIVIL,ZESTRIL) 10 MG tablet Take 1 tablet (10 mg total) by mouth daily. 10/02/16   Geoffery Lyonselo, Douglas, MD  traMADol (ULTRAM) 50 MG tablet Take 1 tablet (50 mg total) by mouth every 6 (six) hours as needed. 09/01/16   Janne NapoleonNeese, Pranika Finks M, NP    Family History History reviewed. No pertinent family history.  Social History Social History  Substance Use Topics  . Smoking status: Current Every  Day Smoker    Packs/day: 0.50    Types: Cigarettes  . Smokeless tobacco: Never Used  . Alcohol use Yes     Comment: occ      Allergies   Patient has no known allergies.   Review of Systems Review of Systems  Constitutional: Positive for chills and fever.  HENT: Positive for sore throat. Negative for ear pain and trouble swallowing.   Eyes: Negative for pain, redness and visual disturbance.  Respiratory: Negative for shortness of breath.   Cardiovascular: Negative for chest pain.  Gastrointestinal: Negative for abdominal pain, diarrhea, nausea and vomiting.  Musculoskeletal: Negative for neck stiffness.  Neurological: Positive for headaches. Negative for light-headedness.  Hematological: Positive for adenopathy.  Psychiatric/Behavioral: The patient is not nervous/anxious.    Physical Exam Updated Vital Signs BP 130/81 (BP Location: Left Arm)   Pulse (!) 112   Temp 98.6 F (37 C) (Oral)   Resp 16   Ht 5\' 7"  (1.702 m)   Wt 81.6 kg (180 lb)   SpO2 96%   BMI 28.19 kg/m   Physical Exam  Constitutional: He is oriented to person, place, and time. He appears well-developed and well-nourished. No distress.  HENT:  Head: Normocephalic and atraumatic.  Right Ear: Tympanic membrane normal.  Left Ear: Tympanic membrane normal.  Nose: Nose normal.  Mouth/Throat: Uvula is midline and mucous membranes are normal. Posterior oropharyngeal erythema present. Tonsillar exudate.  Tonsils enlarged with exudates bilaterally.   Eyes: Conjunctivae are normal.  Neck:  Anterior cervical lymphadenopathy  Cardiovascular: Normal rate and regular rhythm.  Pulmonary/Chest: Effort normal and breath sounds normal.  Abdominal: He exhibits no distension.  Lymphadenopathy:    He has cervical adenopathy.  Neurological: He is alert and oriented to person, place, and time.  Skin: Skin is warm and dry.  Psychiatric: He has a normal mood and affect. His behavior is normal.  Nursing note and vitals  reviewed.  ED Treatments / Results  DIAGNOSTIC STUDIES:  Oxygen Saturation is 96% on RA, adequate by my interpretation.    COORDINATION OF CARE:  8:01 PM Discussed treatment plan with pt at bedside and pt agreed to plan.   Labs (all labs ordered are listed, but only abnormal results are displayed) Labs Reviewed  RAPID STREP SCREEN (NOT AT [facility]) - Abnormal; Notable for the following:       Result Value   Streptococcus, Group A Screen (Direct) POSITIVE (*)    All other components within normal limits    Radiology No results found.  Procedures Procedures (including critical care time)  Medications Ordered in ED Medications  penicillin g benzathine (BICILLIN LA) 1200000 UNIT/2ML injection 1.2 Million Units (not administered)  dexamethasone (DECADRON) tablet 10 mg (not administered)     Initial Impression / Assessment and Plan / ED Course  I have reviewed the triage vital signs and the nursing notes. Pt rapid strep test positive. Pt is tolerating secretions. Presentation not concerning for peritonsillar abscess or spread of infection to deep spaces of the throat; patent airway. Pt given decadron 10 mg PO and Penicillin G 1.2 m/u IM prior to d/c.  Specific return precautions discussed. Recommended PCP follow up. Pt appears safe for discharge.    Final Clinical Impressions(s) / ED Diagnoses   Final diagnoses:  Strep throat  Tonsillar exudate    New Prescriptions New Prescriptions   No medications on file  I personally performed the services described in this documentation, which was scribed in my presence. The recorded information has been reviewed and is accurate.    Kerrie Buffalo [place], [place] 06/19/17 2014    Marily Memos, MD 06/19/17 2022

## 2017-06-19 NOTE — ED Notes (Signed)
Pt stable, ambulatory, states understanding of discharge instructions 

## 2017-06-19 NOTE — Discharge Instructions (Signed)
If you develop difficulty swallowing, severe headache that does not go away with tylenol or ibuprofen, stiff neck or other problems, return immediately.  Take tylenol and ibuprofen for pain and fever.

## 2017-06-24 ENCOUNTER — Emergency Department (HOSPITAL_COMMUNITY)
Admission: EM | Admit: 2017-06-24 | Discharge: 2017-06-24 | Disposition: A | Discharge status: Home / Self Care | Payer: Self-pay | Attending: Emergency Medicine | Admitting: Emergency Medicine

## 2017-06-24 ENCOUNTER — Emergency Department (HOSPITAL_COMMUNITY): Payer: Self-pay

## 2017-06-24 ENCOUNTER — Encounter (HOSPITAL_COMMUNITY): Payer: Self-pay

## 2017-06-24 DIAGNOSIS — W228XXA Striking against or struck by other objects, initial encounter: Secondary | ICD-10-CM | POA: Insufficient documentation

## 2017-06-24 DIAGNOSIS — Y9389 Activity, other specified: Secondary | ICD-10-CM | POA: Insufficient documentation

## 2017-06-24 DIAGNOSIS — R0789 Other chest pain: Secondary | ICD-10-CM | POA: Insufficient documentation

## 2017-06-24 DIAGNOSIS — Z720 Tobacco use: Secondary | ICD-10-CM

## 2017-06-24 DIAGNOSIS — I10 Essential (primary) hypertension: Secondary | ICD-10-CM | POA: Insufficient documentation

## 2017-06-24 DIAGNOSIS — F17210 Nicotine dependence, cigarettes, uncomplicated: Secondary | ICD-10-CM | POA: Insufficient documentation

## 2017-06-24 DIAGNOSIS — Y929 Unspecified place or not applicable: Secondary | ICD-10-CM | POA: Insufficient documentation

## 2017-06-24 DIAGNOSIS — Y999 Unspecified external cause status: Secondary | ICD-10-CM | POA: Insufficient documentation

## 2017-06-24 DIAGNOSIS — S60041A Contusion of right ring finger without damage to nail, initial encounter: Secondary | ICD-10-CM | POA: Insufficient documentation

## 2017-06-24 LAB — BASIC METABOLIC PANEL
ANION GAP: 8 (ref 5–15)
BUN: 8 mg/dL (ref 6–20)
CALCIUM: 9 mg/dL (ref 8.9–10.3)
CO2: 24 mmol/L (ref 22–32)
Chloride: 107 mmol/L (ref 101–111)
Creatinine, Ser: 0.94 mg/dL (ref 0.61–1.24)
GLUCOSE: 102 mg/dL — AB (ref 65–99)
POTASSIUM: 3.4 mmol/L — AB (ref 3.5–5.1)
SODIUM: 139 mmol/L (ref 135–145)

## 2017-06-24 LAB — CBC
HEMATOCRIT: 42.6 % (ref 39.0–52.0)
HEMOGLOBIN: 14.7 g/dL (ref 13.0–17.0)
MCH: 29.3 pg (ref 26.0–34.0)
MCHC: 34.5 g/dL (ref 30.0–36.0)
MCV: 85 fL (ref 78.0–100.0)
Platelets: 206 10*3/uL (ref 150–400)
RBC: 5.01 MIL/uL (ref 4.22–5.81)
RDW: 11.9 % (ref 11.5–15.5)
WBC: 6.8 10*3/uL (ref 4.0–10.5)

## 2017-06-24 LAB — I-STAT TROPONIN, ED: Troponin i, poc: 0.01 ng/mL (ref 0.00–0.08)

## 2017-06-24 MED ORDER — MELOXICAM 15 MG PO TABS: 15.0000 mg | ORAL_TABLET | Freq: Every day | ORAL | 0 refills | Status: DC

## 2017-06-24 NOTE — ED Triage Notes (Signed)
Pt states he injured his right hand by punching concrete on Monday. He reports this morning he began having chest pain that woke him up from sleep. No associated symptoms, skin warm and dry.

## 2017-06-24 NOTE — ED Provider Notes (Signed)
[facility] Provider Note   CSN: [id number] Arrival date & time: 06/24/17  4098     History   Chief Complaint Chief Complaint  Patient presents with  . Hand Injury  . Chest Pain    HPI Howard Hayes is a 21 y.o. male who presents for hand pain and chest pain. The patient found out yesterday that his fiance is cheating on him and he punched a wall. He punched a wall and injured his R hand. He is L hand dominant. HE c/o pain but denies numbness or tingling. He also c/o cp. This began this morning. It began after he woke up and did not wake him. He characterizes it as sharp, left sided, does not radiate, and lasted for 1 hour. He has had episodes like this before. He has a hx of heart murmur and htn. He is not taking meds for this at this time. Denies DOE, SOB, chest tightness or pressure, radiation to left arm, jaw or back, nausea, or diaphoresis. He denies any current CP or SOB. He is a daily smoker with a 10 pack year hx. He denies drug abuse. He denies recent travel, confinement, surgery, hemoptysis.    HPI  Past Medical History:  Diagnosis Date  . Heart murmur   . Heart valve problem    unsure  . Hypertension     There are no active problems to display for this patient.   History reviewed. No pertinent surgical history.     Home Medications    Prior to Admission medications   Medication Sig Start Date End Date Taking? Authorizing Provider  meloxicam (MOBIC) 15 MG tablet Take 1 tablet (15 mg total) by mouth daily. Take 1 daily with food. 06/24/17   Arthor Captain, PA-C    Family History History reviewed. No pertinent family history.  Social History Social History  Substance Use Topics  . Smoking status: Current Every Day Smoker    Packs/day: 0.50    Types: Cigarettes  . Smokeless tobacco: Never Used  . Alcohol use Yes     Comment: occ      Allergies   Patient has no known allergies.   Review of Systems Review of Systems  Ten systems  reviewed and are negative for acute change, except as noted in the HPI.   Physical Exam Updated Vital Signs BP (!) 162/88   Pulse 76   Temp 98.3 F (36.8 C) (Oral)   Resp (!) 24   Ht 5\' 7"  (1.702 m)   Wt 81.6 kg (180 lb)   SpO2 98%   BMI 28.19 kg/m   Physical Exam  Constitutional: He appears well-developed and well-nourished. No distress.  HENT:  Head: Normocephalic and atraumatic.  Eyes: Conjunctivae are normal. No scleral icterus.  Neck: Normal range of motion. Neck supple.  Cardiovascular: Normal rate, regular rhythm and normal heart sounds.   Pulmonary/Chest: Effort normal and breath sounds normal. No respiratory distress. He exhibits tenderness.  TTP Left Chest wall. Reproduces pain of complaint.  Abdominal: Soft. There is no tenderness.  Musculoskeletal: He exhibits no edema.  Swelling and abrasions over the #R 4th MCP.  No bony tendereness  FROM of the fingers. Strong grip.  Neurological: He is alert.  Skin: Skin is warm and dry. He is not diaphoretic.  Psychiatric: His behavior is normal.  Nursing note and vitals reviewed.    ED Treatments / Results  Labs (all labs ordered are listed, but only abnormal results are displayed) Labs Reviewed  BASIC  METABOLIC PANEL - Abnormal; Notable for the following:       Result Value   Potassium 3.4 (*)    Glucose, Bld 102 (*)    All other components within normal limits  CBC  I-STAT TROPONIN, ED    EKG  EKG Interpretation  Date/Time:  Friday June 24 2017 05:43:02 EDT Ventricular Rate:  105 PR Interval:    QRS Duration: 89 QT Interval:  331 QTC Calculation: 438 R Axis:   70 Text Interpretation:  Sinus tachycardia Borderline repolarization abnormality Confirmed by Geoffery LyonseLo, Douglas ([id number]) on 06/24/2017 6:02:19 AM       Radiology Dg Chest 2 View  Result Date: 06/24/2017 CLINICAL DATA:  Chest pain EXAM: CHEST  2 VIEW COMPARISON:  None. FINDINGS: The heart size and mediastinal contours are within normal limits. Both  lungs are clear. The visualized skeletal structures are unremarkable. IMPRESSION: No active cardiopulmonary disease. Electronically Signed   By: Deatra RobinsonKevin  Herman M.D.   On: 06/24/2017 06:43   Dg Hand Complete Right  Result Date: 06/24/2017 CLINICAL DATA:  21 year old male with injury to the right hand. EXAM: RIGHT HAND - COMPLETE 3+ VIEW COMPARISON:  Radiograph dated 09/01/2016 FINDINGS: There is no acute fracture or dislocation. The bones are well mineralized. No arthritic changes. There is mild soft tissue swelling over the dorsum of the hand at the metacarpophalangeal level. No radiopaque foreign object. IMPRESSION: No acute fracture or dislocation. Electronically Signed   By: Elgie CollardArash  Radparvar M.D.   On: 06/24/2017 06:44    Procedures Procedures (including critical care time)  Medications Ordered in ED Medications - No data to display   Initial Impression / Assessment and Plan / ED Course  I have reviewed the triage vital signs and the nursing notes.  Pertinent labs & imaging results that were available during my care of the patient were reviewed by me and considered in my medical decision making (see chart for details).     The patient was counseled on the dangers of tobacco use, and was advised to quit.  Reviewed strategies to maximize success, including removing cigarettes and smoking materials from environment, stress management, substitution of other forms of reinforcement, support of family/friends and written materials.   Patient with a Contusion to the right hand after punching a wall, no evidence of fracture. Patient will be placed in finger splint. He has a heart score of 3 secondary to risk factors that include hypertension, smoking and family history of early MI. Patient also has early re-pole on his EKG. He is still low risk for major adverse coronary event. The patient denies chest pain, shortness of breath or history of PE. His pain is reproducible. Also, he is not able to be  ruled out with a pulmonary embolus rule out criteria, I feel he is extremely low risk. He denies shortness of breath. He appears safe for discharge at this time. I have given the patient strict return precautions. The patient is up-to-date on his tetanus vaccination.  Final Clinical Impressions(s) / ED Diagnoses   Final diagnoses:  Contusion of right ring finger without damage to nail, initial encounter  Chest wall pain  Tobacco abuse  Hypertension, unspecified type    New Prescriptions New Prescriptions   MELOXICAM (MOBIC) 15 MG TABLET    Take 1 tablet (15 mg total) by mouth daily. Take 1 daily with food.     Arthor CaptainHarris, Vinetta Brach, PA-C 06/24/17 [id number]    Geoffery Lyonselo, Douglas, MD 06/24/17 [id number]

## 2017-06-24 NOTE — Discharge Instructions (Signed)
You have been diagnosed by your caregiver as having chest wall pain. SEEK IMMEDIATE MEDICAL ATTENTION IF: You develop a fever.  Your chest pains become severe or intolerable.  You develop new, unexplained symptoms (problems).  You develop shortness of breath, nausea, vomiting, sweating or feel light headed.  You develop a new cough or you cough up blood.  Contact a health care provider if: Your symptoms do not improve after several days of treatment. Your symptoms get worse. You have difficulty moving the injured area. Get help right away if: You have severe pain. You have numbness in a hand or foot. Your hand or foot turns pale or cold.

## 2018-02-18 ENCOUNTER — Other Ambulatory Visit: Payer: Self-pay

## 2018-02-18 ENCOUNTER — Emergency Department (HOSPITAL_COMMUNITY)
Admission: EM | Admit: 2018-02-18 | Discharge: 2018-02-18 | Disposition: A | Discharge status: Home / Self Care | Payer: Self-pay | Attending: Emergency Medicine | Admitting: Emergency Medicine

## 2018-02-18 ENCOUNTER — Encounter (HOSPITAL_COMMUNITY): Payer: Self-pay

## 2018-02-18 DIAGNOSIS — F17210 Nicotine dependence, cigarettes, uncomplicated: Secondary | ICD-10-CM | POA: Insufficient documentation

## 2018-02-18 DIAGNOSIS — W25XXXA Contact with sharp glass, initial encounter: Secondary | ICD-10-CM | POA: Insufficient documentation

## 2018-02-18 DIAGNOSIS — I10 Essential (primary) hypertension: Secondary | ICD-10-CM | POA: Insufficient documentation

## 2018-02-18 DIAGNOSIS — Y939 Activity, unspecified: Secondary | ICD-10-CM | POA: Insufficient documentation

## 2018-02-18 DIAGNOSIS — Y929 Unspecified place or not applicable: Secondary | ICD-10-CM | POA: Insufficient documentation

## 2018-02-18 DIAGNOSIS — Y999 Unspecified external cause status: Secondary | ICD-10-CM | POA: Insufficient documentation

## 2018-02-18 DIAGNOSIS — Z79899 Other long term (current) drug therapy: Secondary | ICD-10-CM | POA: Insufficient documentation

## 2018-02-18 DIAGNOSIS — S99921A Unspecified injury of right foot, initial encounter: Secondary | ICD-10-CM | POA: Insufficient documentation

## 2018-02-18 MED ORDER — IBUPROFEN 400 MG PO TABS: 400.0000 mg | ORAL_TABLET | Freq: Four times a day (QID) | ORAL | 0 refills | Status: DC | PRN

## 2018-02-18 NOTE — ED Provider Notes (Signed)
[facility] Provider Note   CSN: [id number] Arrival date & time: 02/18/18  1507     History   Chief Complaint Chief Complaint  Patient presents with  . Foot Injury    HPI Howard Hayes is a 22 y.o. male with past medical history significant for hypertension presenting with right great toe pain and tingling after removing a piece of glass that was embedded in it yesterday.  He denies any pain otherwise since removal.  He is concerned that he may not be able to work and have to rest.  Has not tried anything for his symptoms.  HPI  Past Medical History:  Diagnosis Date  . Heart murmur   . Heart valve problem    unsure  . Hypertension     There are no active problems to display for this patient.   History reviewed. No pertinent surgical history.     Home Medications    Prior to Admission medications   Medication Sig Start Date End Date Taking? Authorizing Provider  ibuprofen (ADVIL,MOTRIN) 400 MG tablet Take 1 tablet (400 mg total) by mouth every 6 (six) hours as needed. 02/18/18   Georgiana ShoreMitchell, Jessica B, PA-C  meloxicam (MOBIC) 15 MG tablet Take 1 tablet (15 mg total) by mouth daily. Take 1 daily with food. 06/24/17   Arthor CaptainHarris, Abigail, PA-C    Family History No family history on file.  Social History Social History   Tobacco Use  . Smoking status: Current Every Day Smoker    Packs/day: 0.50    Types: Cigarettes  . Smokeless tobacco: Never Used  Substance Use Topics  . Alcohol use: Yes    Comment: occ   . Drug use: No     Allergies   Patient has no known allergies.   Review of Systems Review of Systems  Constitutional: Negative for chills and fever.  Skin: Positive for wound. Negative for color change, pallor and rash.  Neurological: Negative for weakness and numbness.       He reports a tingling sensation since he removed a piece of glass.     Physical Exam Updated Vital Signs BP (!) 150/89   Pulse 95   Temp  98.8 F (37.1 C)   Resp 18   Wt 81.6 kg (180 lb)   SpO2 100%   BMI 28.19 kg/m   Physical Exam  Constitutional: He appears well-developed and well-nourished. No distress.  Well-appearing, afebrile nontoxic sitting comfortably in bed no acute distress.  HENT:  Head: Normocephalic and atraumatic.  Eyes: Conjunctivae and EOM are normal.  Neck: Normal range of motion.  Cardiovascular: Normal rate.  No murmur heard. Pulmonary/Chest: Effort normal. No respiratory distress.  Musculoskeletal: Normal range of motion. He exhibits no edema, tenderness or deformity.  Neurological: He is alert. No sensory deficit.  Skin: Skin is warm and dry. No rash noted. He is not diaphoretic. No erythema. No pallor.  Pinpoint evidence of 1 mm wound to the pad of the right great toe.  No evidence of foreign body retained.  No Bleeding, erythema, edema or evidence of infection.  Psychiatric: He has a normal mood and affect.  Nursing note and vitals reviewed.    ED Treatments / Results  Labs (all labs ordered are listed, but only abnormal results are displayed) Labs Reviewed - No data to display  EKG  EKG Interpretation None       Radiology No results found.  Procedures Procedures (including critical care time)  Medications Ordered  in ED Medications - No data to display   Initial Impression / Assessment and Plan / ED Course  I have reviewed the triage vital signs and the nursing notes.  Pertinent labs & imaging results that were available during my care of the patient were reviewed by me and considered in my medical decision making (see chart for details).    Patient presenting with tingling sensation at the site of puncture wound from a piece of glass that he has removed yesterday.  No bleeding, evidence of foreign body to remove.  Patient has full range of motion, no bleeding erythema or edema. Sensation intact. The area was dressed with padded dressing for comfort.  Discharge home  with follow-up with PCP and return if symptoms worsen. Patient understood and agreed with discharge plan.  Final Clinical Impressions(s) / ED Diagnoses   Final diagnoses:  Injury of right foot, initial encounter    ED Discharge Orders        Ordered    ibuprofen (ADVIL,MOTRIN) 400 MG tablet  Every 6 hours PRN     02/18/18 1547       Georgiana Shore, PA-C 02/19/18 1606    Tegeler, Canary Brim, MD 02/20/18 1021

## 2018-02-18 NOTE — ED Notes (Signed)
Declined W/C at D/C and was escorted to lobby by RN. 

## 2018-02-18 NOTE — ED Triage Notes (Signed)
Pt presents to the ed with complaints of finding a piece of glass in his toe last night and when he took it out his toe began feeling tingly. Pt has small puncture wound the size of a pen tip on the bottom of his big toe, no bleeding present.

## 2018-02-18 NOTE — Discharge Instructions (Signed)
As discussed, monitor for any signs of infection.  Take ibuprofen or Tylenol for pain.  Keep the area clean and padded for comfort.  Follow-up with the wellness center to establish care with a primary care provider and address blood pressure medication refill has needed.  Resources such as good [employer] may be helpful to obtain medications at low cost.  Return to the emergency department if you experience any worsening, signs of infection or other new concerning symptoms in the meantime.

## 2018-03-20 ENCOUNTER — Emergency Department (HOSPITAL_COMMUNITY)
Admission: EM | Admit: 2018-03-20 | Discharge: 2018-03-20 | Disposition: A | Discharge status: Home / Self Care | Payer: Self-pay | Attending: Emergency Medicine | Admitting: Emergency Medicine

## 2018-03-20 ENCOUNTER — Encounter (HOSPITAL_COMMUNITY): Payer: Self-pay | Admitting: Emergency Medicine

## 2018-03-20 ENCOUNTER — Emergency Department (HOSPITAL_COMMUNITY): Payer: Self-pay

## 2018-03-20 ENCOUNTER — Other Ambulatory Visit: Payer: Self-pay

## 2018-03-20 DIAGNOSIS — W2209XA Striking against other stationary object, initial encounter: Secondary | ICD-10-CM | POA: Insufficient documentation

## 2018-03-20 DIAGNOSIS — I10 Essential (primary) hypertension: Secondary | ICD-10-CM | POA: Insufficient documentation

## 2018-03-20 DIAGNOSIS — F17210 Nicotine dependence, cigarettes, uncomplicated: Secondary | ICD-10-CM | POA: Insufficient documentation

## 2018-03-20 DIAGNOSIS — S60511A Abrasion of right hand, initial encounter: Secondary | ICD-10-CM | POA: Insufficient documentation

## 2018-03-20 DIAGNOSIS — Y999 Unspecified external cause status: Secondary | ICD-10-CM | POA: Insufficient documentation

## 2018-03-20 DIAGNOSIS — Y9389 Activity, other specified: Secondary | ICD-10-CM | POA: Insufficient documentation

## 2018-03-20 DIAGNOSIS — Y929 Unspecified place or not applicable: Secondary | ICD-10-CM | POA: Insufficient documentation

## 2018-03-20 DIAGNOSIS — S60221A Contusion of right hand, initial encounter: Secondary | ICD-10-CM | POA: Insufficient documentation

## 2018-03-20 MED ORDER — NAPROXEN 500 MG PO TABS: 500.0000 mg | ORAL_TABLET | Freq: Two times a day (BID) | ORAL | 0 refills | Status: DC

## 2018-03-20 MED ORDER — AMOXICILLIN-POT CLAVULANATE 875-125 MG PO TABS: 1.0000 | ORAL_TABLET | Freq: Two times a day (BID) | ORAL | 0 refills | Status: AC

## 2018-03-20 MED ORDER — AMOXICILLIN-POT CLAVULANATE 875-125 MG PO TABS: 1.0000 | ORAL_TABLET | Freq: Two times a day (BID) | ORAL | 0 refills | Status: DC

## 2018-03-20 MED ORDER — BACITRACIN ZINC 500 UNIT/GM EX OINT
TOPICAL_OINTMENT | CUTANEOUS | Status: AC
  Administered 2018-03-20: 03:00:00
  Filled 2018-03-20: qty 0.9

## 2018-03-20 NOTE — ED Triage Notes (Signed)
Pt reports injuring right hand 2 days ago after hitting it against concrete. Scabbing noted to knuckles along with painful movement.

## 2018-03-20 NOTE — Discharge Instructions (Addendum)
1. Medications: alternate naprosyn and tylenol for pain control, take augmentin for the entire course; usual home medications 2. Treatment: rest, ice, elevate, drink plenty of fluids, gentle stretching 3. Follow Up: Please followup with PCP in 1 week for discussion of your diagnoses and further evaluation after today's visit; if you do not have a primary care doctor use the resource guide provided to find one; Please return to the ER for worsening pain, development of redness, purulent drainage, difficulty moving fingers or redness that extends to the hand or arm or other concerns
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
## 2018-03-29 ENCOUNTER — Other Ambulatory Visit: Payer: Self-pay

## 2018-03-29 ENCOUNTER — Emergency Department (HOSPITAL_COMMUNITY)
Admission: EM | Admit: 2018-03-29 | Discharge: 2018-03-29 | Disposition: A | Discharge status: Home / Self Care | Payer: Self-pay | Attending: Emergency Medicine | Admitting: Emergency Medicine

## 2018-03-29 ENCOUNTER — Encounter (HOSPITAL_COMMUNITY): Payer: Self-pay

## 2018-03-29 ENCOUNTER — Emergency Department (HOSPITAL_COMMUNITY): Payer: Self-pay

## 2018-03-29 DIAGNOSIS — Z5321 Procedure and treatment not carried out due to patient leaving prior to being seen by health care provider: Secondary | ICD-10-CM | POA: Insufficient documentation

## 2018-03-29 DIAGNOSIS — R079 Chest pain, unspecified: Secondary | ICD-10-CM | POA: Insufficient documentation

## 2018-03-29 LAB — I-STAT TROPONIN, ED: Troponin i, poc: 0 ng/mL (ref 0.00–0.08)

## 2018-03-29 LAB — CBC
HCT: 45.8 % (ref 39.0–52.0)
Hemoglobin: 16 g/dL (ref 13.0–17.0)
MCH: 30 pg (ref 26.0–34.0)
MCHC: 34.9 g/dL (ref 30.0–36.0)
MCV: 85.8 fL (ref 78.0–100.0)
Platelets: 226 10*3/uL (ref 150–400)
RBC: 5.34 MIL/uL (ref 4.22–5.81)
RDW: 11.9 % (ref 11.5–15.5)
WBC: 6.5 10*3/uL (ref 4.0–10.5)

## 2018-03-29 LAB — BASIC METABOLIC PANEL
Anion gap: 11 (ref 5–15)
BUN: 10 mg/dL (ref 6–20)
CO2: 22 mmol/L (ref 22–32)
Calcium: 9.3 mg/dL (ref 8.9–10.3)
Chloride: 107 mmol/L (ref 101–111)
Creatinine, Ser: 0.9 mg/dL (ref 0.61–1.24)
GFR calc Af Amer: >60 mL/min (ref >60)
GLUCOSE: 100 mg/dL — AB (ref 65–99)
POTASSIUM: 4.4 mmol/L (ref 3.5–5.1)
Sodium: 140 mmol/L (ref 135–145)

## 2018-03-29 LAB — BRAIN NATRIURETIC PEPTIDE: B Natriuretic Peptide: 19.3 pg/mL (ref 0.0–100.0)

## 2018-03-29 NOTE — ED Triage Notes (Signed)
Pt presents for evaluation of chest pain starting today. Pt reports hx of "valve problems" and heart murmur from childhood.

## 2018-03-29 NOTE — ED Notes (Signed)
Called Patient to reassess vitals x3 and had no response. 

## 2018-03-30 ENCOUNTER — Emergency Department (HOSPITAL_COMMUNITY)
Admission: EM | Admit: 2018-03-30 | Discharge: 2018-03-30 | Disposition: A | Discharge status: Home / Self Care | Payer: Self-pay | Attending: Emergency Medicine | Admitting: Emergency Medicine

## 2018-03-30 ENCOUNTER — Encounter (HOSPITAL_COMMUNITY): Payer: Self-pay | Admitting: Emergency Medicine

## 2018-03-30 DIAGNOSIS — Z79899 Other long term (current) drug therapy: Secondary | ICD-10-CM | POA: Insufficient documentation

## 2018-03-30 DIAGNOSIS — I10 Essential (primary) hypertension: Secondary | ICD-10-CM | POA: Insufficient documentation

## 2018-03-30 DIAGNOSIS — F411 Generalized anxiety disorder: Secondary | ICD-10-CM | POA: Insufficient documentation

## 2018-03-30 DIAGNOSIS — R45851 Suicidal ideations: Secondary | ICD-10-CM

## 2018-03-30 DIAGNOSIS — Z7289 Other problems related to lifestyle: Secondary | ICD-10-CM

## 2018-03-30 DIAGNOSIS — F17210 Nicotine dependence, cigarettes, uncomplicated: Secondary | ICD-10-CM | POA: Insufficient documentation

## 2018-03-30 DIAGNOSIS — F329 Major depressive disorder, single episode, unspecified: Secondary | ICD-10-CM | POA: Insufficient documentation

## 2018-03-30 LAB — COMPREHENSIVE METABOLIC PANEL
ALBUMIN: 4.4 g/dL (ref 3.5–5.0)
ALK PHOS: 73 U/L (ref 38–126)
ALT: 19 U/L (ref 17–63)
ANION GAP: 9 (ref 5–15)
AST: 16 U/L (ref 15–41)
BILIRUBIN TOTAL: 0.8 mg/dL (ref 0.3–1.2)
BUN: 11 mg/dL (ref 6–20)
CALCIUM: 9.1 mg/dL (ref 8.9–10.3)
CO2: 23 mmol/L (ref 22–32)
Chloride: 105 mmol/L (ref 101–111)
Creatinine, Ser: 0.98 mg/dL (ref 0.61–1.24)
GLUCOSE: 98 mg/dL (ref 65–99)
POTASSIUM: 4.5 mmol/L (ref 3.5–5.1)
Sodium: 137 mmol/L (ref 135–145)
TOTAL PROTEIN: 7.2 g/dL (ref 6.5–8.1)

## 2018-03-30 LAB — RAPID URINE DRUG SCREEN, HOSP PERFORMED
Amphetamines: NOT DETECTED
BARBITURATES: NOT DETECTED
Benzodiazepines: NOT DETECTED
COCAINE: NOT DETECTED
Opiates: NOT DETECTED
Tetrahydrocannabinol: POSITIVE — AB

## 2018-03-30 LAB — CBC
HEMATOCRIT: 45.1 % (ref 39.0–52.0)
Hemoglobin: 15.7 g/dL (ref 13.0–17.0)
MCH: 29.8 pg (ref 26.0–34.0)
MCHC: 34.8 g/dL (ref 30.0–36.0)
MCV: 85.6 fL (ref 78.0–100.0)
PLATELETS: 222 10*3/uL (ref 150–400)
RBC: 5.27 MIL/uL (ref 4.22–5.81)
RDW: 11.7 % (ref 11.5–15.5)
WBC: 10.8 10*3/uL — AB (ref 4.0–10.5)

## 2018-03-30 LAB — ACETAMINOPHEN LEVEL

## 2018-03-30 LAB — ETHANOL

## 2018-03-30 LAB — SALICYLATE LEVEL: Salicylate Lvl: <7 mg/dL (ref 2.8–30.0)

## 2018-03-30 MED ORDER — NICOTINE 21 MG/24HR TD PT24: 21.0000 mg | MEDICATED_PATCH | Freq: Every day | TRANSDERMAL | Status: DC

## 2018-03-30 MED ORDER — ALUM & MAG HYDROXIDE-SIMETH 200-200-20 MG/5ML PO SUSP: 30.0000 mL | Freq: Four times a day (QID) | ORAL | Status: DC | PRN

## 2018-03-30 MED ORDER — ZOLPIDEM TARTRATE 5 MG PO TABS: 5.0000 mg | ORAL_TABLET | Freq: Every evening | ORAL | Status: DC | PRN

## 2018-03-30 MED ORDER — IBUPROFEN 400 MG PO TABS: 600.0000 mg | ORAL_TABLET | Freq: Three times a day (TID) | ORAL | Status: DC | PRN

## 2018-03-30 MED ORDER — ONDANSETRON HCL 4 MG PO TABS: 4.0000 mg | ORAL_TABLET | Freq: Three times a day (TID) | ORAL | Status: DC | PRN

## 2018-03-30 NOTE — Consult Note (Signed)
Tele Assessment    Howard Hayes, 22 y.o., male [facility] making multiple superficial cuts to his leg.  Patient seen via telepsych by TTS and  this provider; chart reviewed and consulted with Dr. Lucianne MussKumar on 03/30/18.  On evaluation Howard Hayes reports that he has a history of cutting and that this incident was not a suicide attempt.  Patient states that he is no longer having any thoughts of cutting.  Patient also denies suicidal/homicidal ideation, psychosis, and paranoia.   During evaluation Howard Hayes is alert/oriented x 4; calm/cooperative; and mood was congruent with affect.  He does not appear to be responding to internal/external stimuli or delusional thoughts.  Patient denied suicidal/self-harm/homicidal ideation, psychosis, and paranoia.  Patient answered question appropriately.  TTS Counselor Merry Proud(Brandi) spoke with patient's mother who confirmed history of self injurious behavior and not suicide attempt and feels that patient is safe to go home.  Patient requesting information for outpatient psychiatric services.     For a detailed note see TTS tele assessment note.  Recommendation:  Outpatient psychiatric services.  Send resources to patient  Disposition: No evidence of imminent risk to self or others at present.   Patient does not meet criteria for psychiatric inpatient admission.   Howard Bronaugh B. Leobardo Granlund, NP

## 2018-03-30 NOTE — BH Assessment (Signed)
Tele Assessment Note   Patient Name: Jeanpaul Biehl MRN: [id number] Referring Physician: Laveda Norman, [place] Location of Patient: [facility] Location of Provider: Behavioral Health TTS Department  Ahron Hulbert is an 22 y.o. male. Pt denies SI/HI and AVH. Pt denies previous SI attempts. Pt reports self-harming. Pt states he cut his thighs last night when he felt depressed. Pt states he has dealt with anxiety throughout his life but depressive symptoms are new. Pt states he has been cutting the past couple of months. Per Pt he contacted his girlfriend last night and informed her that he cut his thighs. Pt's girlfriend then contacted the police. Per Pt he did not state that he was suicidal to his girlfriend. Pt denies previous inpatient treatment. Pt denies current outpatient treatment.   Collateral Contact: Pt's gave this Clinical research associate permission to contact his mother. Per Pt's mother states that Pt has admitted to self-harming. She does not feel the Pt is in danger of hurting himself. Pt's mother states she feels the Pt needs outpatient therapy.   Shuvon, NP reassessed the Pt via tele-psych. Shuvon recommends D/C and follow-up with outpatient resources. Resources faxed.   Diagnosis:  F32.9 depression; F41.1 GAD  Past Medical History:  Past Medical History:  Diagnosis Date  . Heart murmur   . Heart valve problem    unsure  . Hypertension     History reviewed. No pertinent surgical history.  Family History: No family history on file.  Social History:  reports that he has been smoking cigarettes.  He has been smoking about 0.50 packs per day. He has never used smokeless tobacco. He reports that he drinks alcohol. He reports that he does not use drugs.  Additional Social History:  Alcohol / Drug Use Pain Medications: please see mar Prescriptions: please see mar Over the Counter: please see mar History of alcohol / drug use?: Yes Longest period of sobriety (when/how long): unknown Substance #1 Name of  Substance 1: marijuana 1 - Age of First Use: unknown 1 - Amount (size/oz): unknown 1 - Frequency: unknown 1 - Duration: unknown 1 - Last Use / Amount: 03/27/18  CIWA: CIWA-Ar BP: 138/80 Pulse Rate: 90 COWS:    Allergies: No Known Allergies  Home Medications:  (Not in a hospital admission)  OB/GYN Status:  No LMP for male patient.  General Assessment Data Location of Assessment: [facility] ED TTS Assessment: In system Is this a Tele or Face-to-Face Assessment?: Tele Assessment Is this an Initial Assessment or a Re-assessment for this encounter?: Initial Assessment Marital status: Single Maiden name: NA Is patient pregnant?: No Pregnancy Status: No Living Arrangements: Other relatives Can pt return to current living arrangement?: Yes Admission Status: Voluntary Is patient capable of signing voluntary admission?: Yes Referral Source: Self/Family/Friend Insurance type: SP     Crisis Care Plan Living Arrangements: Other relatives Legal Guardian: Other:(self) Name of Psychiatrist: NA Name of Therapist: NA  Education Status Is patient currently in school?: No Is the patient employed, unemployed or receiving disability?: Employed  Risk to self with the past 6 months Suicidal Ideation: No Has patient been a risk to self within the past 6 months prior to admission? : No Suicidal Intent: No Has patient had any suicidal intent within the past 6 months prior to admission? : No Is patient at risk for suicide?: No Suicidal Plan?: No-Not Currently/Within Last 6 Months Has patient had any suicidal plan within the past 6 months prior to admission? : No Access to Means: No What has been your use  of drugs/alcohol within the last 12 months?: marijuana Previous Attempts/Gestures: No How many times?: 0 Other Self Harm Risks: NA Triggers for Past Attempts: None known Intentional Self Injurious Behavior: None Family Suicide History: No Recent stressful life event(s): Trauma  (Comment) Persecutory voices/beliefs?: No Depression: Yes Depression Symptoms: Insomnia, Tearfulness, Isolating, Loss of interest in usual pleasures Substance abuse history and/or treatment for substance abuse?: No Suicide prevention information given to non-admitted patients: Not applicable  Risk to Others within the past 6 months Homicidal Ideation: No Does patient have any lifetime risk of violence toward others beyond the six months prior to admission? : No Thoughts of Harm to Others: No Current Homicidal Intent: No Current Homicidal Plan: No Access to Homicidal Means: No Identified Victim: NA History of harm to others?: No Assessment of Violence: None Noted Violent Behavior Description: NA Does patient have access to weapons?: No Criminal Charges Pending?: No Does patient have a court date: No Is patient on probation?: No  Psychosis Hallucinations: None noted Delusions: None noted  Mental Status Report Appearance/Hygiene: Unremarkable Eye Contact: Fair Motor Activity: Freedom of movement Speech: Logical/coherent Level of Consciousness: Alert Mood: Depressed, Anxious Affect: Anxious, Depressed Anxiety Level: Minimal Thought Processes: Coherent, Relevant Judgement: Unimpaired Orientation: Person, Place, Time, Situation Obsessive Compulsive Thoughts/Behaviors: None  Cognitive Functioning Concentration: Normal Memory: Recent Intact, Remote Intact Is patient IDD: No Is patient DD?: No Insight: Fair Impulse Control: Fair Appetite: Fair Have you had any weight changes? : No Change Sleep: No Change Total Hours of Sleep: 8 Vegetative Symptoms: None  ADLScreening [facility]) Patient's cognitive ability adequate to safely complete daily activities?: Yes Patient able to express need for assistance with ADLs?: Yes Independently performs ADLs?: Yes (appropriate for developmental age)  Prior Inpatient Therapy Prior Inpatient Therapy: No  Prior  Outpatient Therapy Prior Outpatient Therapy: Yes Prior Therapy Dates: unknown Prior Therapy Facilty/Provider(s): unknown Reason for Treatment: NA Does patient have an ACCT team?: No Does patient have Intensive In-House Services?  : No Does patient have [facility] services? : No Does patient have P4CC services?: No  ADL Screening (condition at time of admission) Patient's cognitive ability adequate to safely complete daily activities?: Yes Is the patient deaf or have difficulty hearing?: No Does the patient have difficulty seeing, even when wearing glasses/contacts?: No Does the patient have difficulty concentrating, remembering, or making decisions?: No Patient able to express need for assistance with ADLs?: Yes Does the patient have difficulty dressing or bathing?: No Independently performs ADLs?: Yes (appropriate for developmental age) Does the patient have difficulty walking or climbing stairs?: No       Abuse/Neglect Assessment (Assessment to be complete while patient is alone) Abuse/Neglect Assessment Can Be Completed: Yes Physical Abuse: Denies Verbal Abuse: Denies Sexual Abuse: Denies Exploitation of patient/patient's resources: Denies     Merchant navy officer (For Healthcare) Does Patient Have a Medical Advance Directive?: No Would patient like information on creating a medical advance directive?: No - Patient declined    Additional Information 1:1 In Past 12 Months?: No CIRT Risk: No Elopement Risk: No Does patient have medical clearance?: No     Disposition:  Disposition Initial Assessment Completed for this Encounter: Yes  This service was provided via telemedicine using a 2-way, interactive audio and video technology.  Names of all persons participating in this telemedicine service and their role in this encounter. Name: Ian Bushman Role: Mother  Name:  Role:   Name:  Role:   Name:  Role:     Wali Reinheimer D  03/30/2018 7:51 AM

## 2018-03-30 NOTE — ED Provider Notes (Signed)
[facility] Provider Note   CSN: [id number] Arrival date & time: 03/30/18  [id number]     History   Chief Complaint Chief Complaint  Patient presents with  . Suicidal    HPI Howard Hayes is a 22 y.o. male.  HPI   22 year old male brought here via EMS from home for suicidal ideation.  Patient report for the past 10 years he has had underlying depression.  He has never been diagnosed with it before but states that both mom and sister has depression.  He has self-harm in the past by cutting himself.  He is recently starting to cut himself again primarily on his right thigh.  He does not have any specific plan but does ruminate about being depressed, and helpless hopeless.  States that his eating and sleeping have it has been bothersome, denies any new changes in his life.  Does admits using marijuana as a coping mechanism, denies significant alcohol use.  He denies homicidal ideation, auditory or visual hallucination.  He is currently not on any medication.  Endorsed occasional chest pain, none currently.  No other complaint.  Past Medical History:  Diagnosis Date  . Heart murmur   . Heart valve problem    unsure  . Hypertension     There are no active problems to display for this patient.   History reviewed. No pertinent surgical history.      Home Medications    Prior to Admission medications   Medication Sig Start Date End Date Taking? Authorizing Provider  amoxicillin-clavulanate (AUGMENTIN) 875-125 MG tablet Take 1 tablet by mouth 2 (two) times daily for 10 days. 03/20/18 03/30/18  Muthersbaugh, Dahlia ClientHannah, PA-C  ibuprofen (ADVIL,MOTRIN) 400 MG tablet Take 1 tablet (400 mg total) by mouth every 6 (six) hours as needed. 02/18/18   Georgiana ShoreMitchell, Jessica B, PA-C  meloxicam (MOBIC) 15 MG tablet Take 1 tablet (15 mg total) by mouth daily. Take 1 daily with food. 06/24/17   Arthor CaptainHarris, Abigail, PA-C  naproxen (NAPROSYN) 500 MG tablet Take 1 tablet (500 mg  total) by mouth 2 (two) times daily with a meal. 03/20/18   Muthersbaugh, Dahlia ClientHannah, PA-C    Family History No family history on file.  Social History Social History   Tobacco Use  . Smoking status: Current Every Day Smoker    Packs/day: 0.50    Types: Cigarettes  . Smokeless tobacco: Never Used  Substance Use Topics  . Alcohol use: Yes    Comment: occ   . Drug use: No     Allergies   Patient has no known allergies.   Review of Systems Review of Systems  All other systems reviewed and are negative.    Physical Exam Updated Vital Signs BP 138/80 (BP Location: Right Arm)   Pulse 90   Temp 98.3 F (36.8 C) (Oral)   Resp 16   Ht 5\' 7"  (1.702 m)   Wt 77.1 kg (170 lb)   SpO2 100%   BMI 26.63 kg/m   Physical Exam  Constitutional: He appears well-developed and well-nourished. No distress.  HENT:  Head: Atraumatic.  Eyes: Conjunctivae are normal.  Neck: Neck supple.  Cardiovascular: Normal rate and regular rhythm.  Pulmonary/Chest: Breath sounds normal.  Abdominal: Soft. Bowel sounds are normal.  Neurological: He is alert. GCS eye subscore is 4. GCS verbal subscore is 5. GCS motor subscore is 6.  Skin: No rash noted.  Right thigh: Multiple linear superficial cuts with scab formation noted to the anterior  thigh without any signs of infection.  Psychiatric: He has a normal mood and affect. His speech is normal and behavior is normal. Thought content is not paranoid. Cognition and memory are normal. He expresses suicidal ideation. He expresses no homicidal ideation.  Nursing note and vitals reviewed.    ED Treatments / Results  Labs (all labs ordered are listed, but only abnormal results are displayed) Labs Reviewed  ACETAMINOPHEN LEVEL - Abnormal; Notable for the following components:      Result Value   Acetaminophen (Tylenol), Serum <10 (*)    All other components within normal limits  CBC - Abnormal; Notable for the following components:   WBC 10.8 (*)     All other components within normal limits  RAPID URINE DRUG SCREEN, HOSP PERFORMED - Abnormal; Notable for the following components:   Tetrahydrocannabinol POSITIVE (*)    All other components within normal limits  COMPREHENSIVE METABOLIC PANEL  ETHANOL  SALICYLATE LEVEL    EKG None  Radiology Dg Chest 2 View  Result Date: 03/29/2018 CLINICAL DATA:  Sharp chest pains today. EXAM: CHEST - 2 VIEW COMPARISON:  06/24/2017. FINDINGS: The heart size and mediastinal contours are within normal limits. Both lungs are clear. The visualized skeletal structures are unremarkable. IMPRESSION: No active cardiopulmonary disease.  Stable appearance from priors. Electronically Signed   By: Elsie Stain M.D.   On: 03/29/2018 15:03    Procedures Procedures (including critical care time)  Medications Ordered in ED Medications  ibuprofen (ADVIL,MOTRIN) tablet 600 mg (has no administration in time range)  zolpidem (AMBIEN) tablet 5 mg (has no administration in time range)  ondansetron (ZOFRAN) tablet 4 mg (has no administration in time range)  alum & mag hydroxide-simeth (MAALOX/MYLANTA) 200-200-20 MG/5ML suspension 30 mL (has no administration in time range)  nicotine (NICODERM CQ - dosed in mg/24 hours) patch 21 mg (has no administration in time range)     Initial Impression / Assessment and Plan / ED Course  I have reviewed the triage vital signs and the nursing notes.  Pertinent labs & imaging results that were available during my care of the patient were reviewed by me and considered in my medical decision making (see chart for details).     BP 138/80 (BP Location: Right Arm)   Pulse 90   Temp 98.3 F (36.8 C) (Oral)   Resp 16   Ht 5\' 7"  (1.702 m)   Wt 77.1 kg (170 lb)   SpO2 100%   BMI 26.63 kg/m    Final Clinical Impressions(s) / ED Diagnoses   Final diagnoses:  Suicidal ideation    ED Discharge Orders    None     6:19 AM Patient with underlying diagnosis of depression  presenting with suicidal ideation but without specific plan.  There appears to be a femoral component of depression.  Does exhibit self-harm including cutting his thigh.  The patient is currently medically cleared, will consult TTS of management.  9:18 AM TTS has seen and evaluated pt. Felt pt stable for discharge.  Resources provided for outpt management.  Return precaution given.    Fayrene Helper, PA-C 03/30/18 1021    Gilda Crease, MD 04/01/18 [id number]

## 2018-03-30 NOTE — ED Triage Notes (Signed)
BIB EMS from home, pt reports feeling suicidal over past few days, states he always feels like this but it was worse tonight. Pt has multiple superficial cuts to R upper leg. Denies drug/ETOH use. Calm and cooperative in triage

## 2018-03-30 NOTE — ED Notes (Signed)
Belongings removed, pt placed in paper scrubs and wanded by security. Per staffing no available sitters at this time. Pt remains in triage with staff

## 2018-03-30 NOTE — ED Notes (Signed)
tts at bedside 

## 2018-04-27 ENCOUNTER — Ambulatory Visit (HOSPITAL_COMMUNITY)
Admission: EM | Admit: 2018-04-27 | Discharge: 2018-04-27 | Disposition: A | Discharge status: Home / Self Care | Payer: Self-pay | Attending: Family Medicine | Admitting: Family Medicine

## 2018-04-27 ENCOUNTER — Encounter (HOSPITAL_COMMUNITY): Payer: Self-pay | Admitting: Emergency Medicine

## 2018-04-27 DIAGNOSIS — T675XXA Heat exhaustion, unspecified, initial encounter: Secondary | ICD-10-CM

## 2018-04-27 DIAGNOSIS — R21 Rash and other nonspecific skin eruption: Secondary | ICD-10-CM

## 2018-04-27 MED ORDER — PREDNISONE 20 MG PO TABS: ORAL_TABLET | ORAL | 0 refills | Status: AC

## 2018-04-27 NOTE — ED Provider Notes (Signed)
[facility]ARE [facility]ENTER   [id number] 04/27/18 Arrival Time: 1532   SUBJECTIVE:  Howard Hayes is a 22 y.o. male who presents to the urgent care with complaint of rash on both arms x2 weeks. States yesterday he had some sort of reaction where he was confused at work. Pt c/o headache, itchiness, stomach pain.  Patient states that he has just recently started working for [employer], 5 intense hours a day without breaks.  He became lightheaded in the hot work environment last night (90-100 degrees) and had trouble speaking for a few minutes and his hands were stiff.  The rash on the arms is itchy and papular  Does not drink or take any medications.  Past Medical History:  Diagnosis Date  . Heart murmur   . Heart valve problem    unsure  . Hypertension    No family history on file. Social History   Socioeconomic History  . Marital status: Single    Spouse name: Not on file  . Number of children: Not on file  . Years of education: Not on file  . Highest education level: Not on file  Occupational History  . Not on file  Social Needs  . Financial resource strain: Not on file  . Food insecurity:    Worry: Not on file    Inability: Not on file  . Transportation needs:    Medical: Not on file    Non-medical: Not on file  Tobacco Use  . Smoking status: Current Every Day Smoker    Packs/day: 0.50    Types: Cigarettes  . Smokeless tobacco: Never Used  Substance and Sexual Activity  . Alcohol use: Yes    Comment: occ   . Drug use: No  . Sexual activity: Not on file  Lifestyle  . Physical activity:    Days per week: Not on file    Minutes per session: Not on file  . Stress: Not on file  Relationships  . Social connections:    Talks on phone: Not on file    Gets together: Not on file    Attends religious service: Not on file    Active member of club or organization: Not on file    Attends meetings of clubs or organizations: Not on file    Relationship status: Not on file  .  Intimate partner violence:    Fear of current or ex partner: Not on file    Emotionally abused: Not on file    Physically abused: Not on file    Forced sexual activity: Not on file  Other Topics Concern  . Not on file  Social History Narrative  . Not on file   No outpatient medications have been marked as taking for the 04/27/18 encounter [facility] Encounter).   No Known Allergies    ROS: As per HPI, remainder of ROS negative.   OBJECTIVE:   Vitals:   04/27/18 1558  BP: 122/85  Pulse: 97  Resp: 18  Temp: 98.2 F (36.8 C)  SpO2: 97%     General appearance: alert; no distress Eyes: PERRL; EOMI; conjunctiva normal HENT: normocephalic; atraumatic;  oral mucosa normal Neck: supple Back: no CVA tenderness Extremities: no cyanosis or edema; symmetrical with no gross deformities Skin: warm and dry; papular excoriated forearm rash.  Mild periorbital erythema. Neurologic: normal gait; grossly normal Psychological: alert and cooperative; normal mood and affect      Labs:  Results for orders placed or performed during the hospital encounter of 03/30/18  Comprehensive metabolic panel  Result Value Ref Range   Sodium 137 135 - 145 mmol/L   Potassium 4.5 3.5 - 5.1 mmol/L   Chloride 105 101 - 111 mmol/L   CO2 23 22 - 32 mmol/L   Glucose, Bld 98 65 - 99 mg/dL   BUN 11 6 - 20 mg/dL   Creatinine, Ser 1.61 0.61 - 1.24 mg/dL   Calcium 9.1 8.9 - 09.6 mg/dL   Total Protein 7.2 6.5 - 8.1 g/dL   Albumin 4.4 3.5 - 5.0 g/dL   AST 16 15 - 41 U/L   ALT 19 17 - 63 U/L   Alkaline Phosphatase 73 38 - 126 U/L   Total Bilirubin 0.8 0.3 - 1.2 mg/dL   GFR calc non Af Amer >60 >60 mL/min   GFR calc Af Amer >60 >60 mL/min   Anion gap 9 5 - 15  Ethanol  Result Value Ref Range   Alcohol, Ethyl (B) <10 <10 mg/dL  Salicylate level  Result Value Ref Range   Salicylate Lvl <7.0 2.8 - 30.0 mg/dL  Acetaminophen level  Result Value Ref Range   Acetaminophen (Tylenol), Serum <10 (L) 10 -  30 ug/mL  cbc  Result Value Ref Range   WBC 10.8 (H) 4.0 - 10.5 K/uL   RBC 5.27 4.22 - 5.81 MIL/uL   Hemoglobin 15.7 13.0 - 17.0 g/dL   HCT 04.5 40.9 - 81.1 %   MCV 85.6 78.0 - 100.0 fL   MCH 29.8 26.0 - 34.0 pg   MCHC 34.8 30.0 - 36.0 g/dL   RDW 91.4 78.2 - 95.6 %   Platelets 222 150 - 400 K/uL  Rapid urine drug screen (hospital performed)  Result Value Ref Range   Opiates NONE DETECTED NONE DETECTED   Cocaine NONE DETECTED NONE DETECTED   Benzodiazepines NONE DETECTED NONE DETECTED   Amphetamines NONE DETECTED NONE DETECTED   Tetrahydrocannabinol POSITIVE (A) NONE DETECTED   Barbiturates NONE DETECTED NONE DETECTED    Labs Reviewed - No data to display  No results found.     ASSESSMENT & PLAN:  1. Rash   2. Heat exhaustion, initial encounter     Meds ordered this encounter  Medications  . predniSONE (DELTASONE) 20 MG tablet    Sig: Two daily with food    Dispense:  6 tablet    Refill:  0  Bring Gatorade/Powerade to work with you Take cool shower before and after work  Return if symptoms (rash or lightheadedness) continue after Saturday  Reviewed expectations re: course of current medical issues. Questions answered. Outlined signs and symptoms indicating need for more acute intervention. Patient verbalized understanding. After Visit Summary given.    Procedures:      Elvina Sidle, MD 04/27/18 1640

## 2018-04-27 NOTE — Discharge Instructions (Addendum)
Bring Gatorade/Powerade to work with you Take cool shower before and after work  Return if symptoms (rash or lightheadedness) continue after Saturday

## 2018-04-27 NOTE — ED Triage Notes (Signed)
Pt c/o rash on both arms x2 weeks. States yesterday he had some sort of reaction where he was confused at work. Pt c/o headache, itchiness, stomach pain.

## 2018-06-20 IMAGING — DX DG CHEST 2V
2 series · 2 of 2 positions shown · non-contrast
Comparison: 06/24/2017.

CLINICAL DATA: Sharp chest pains today.

EXAM:
CHEST - 2 VIEW

[chest pa]
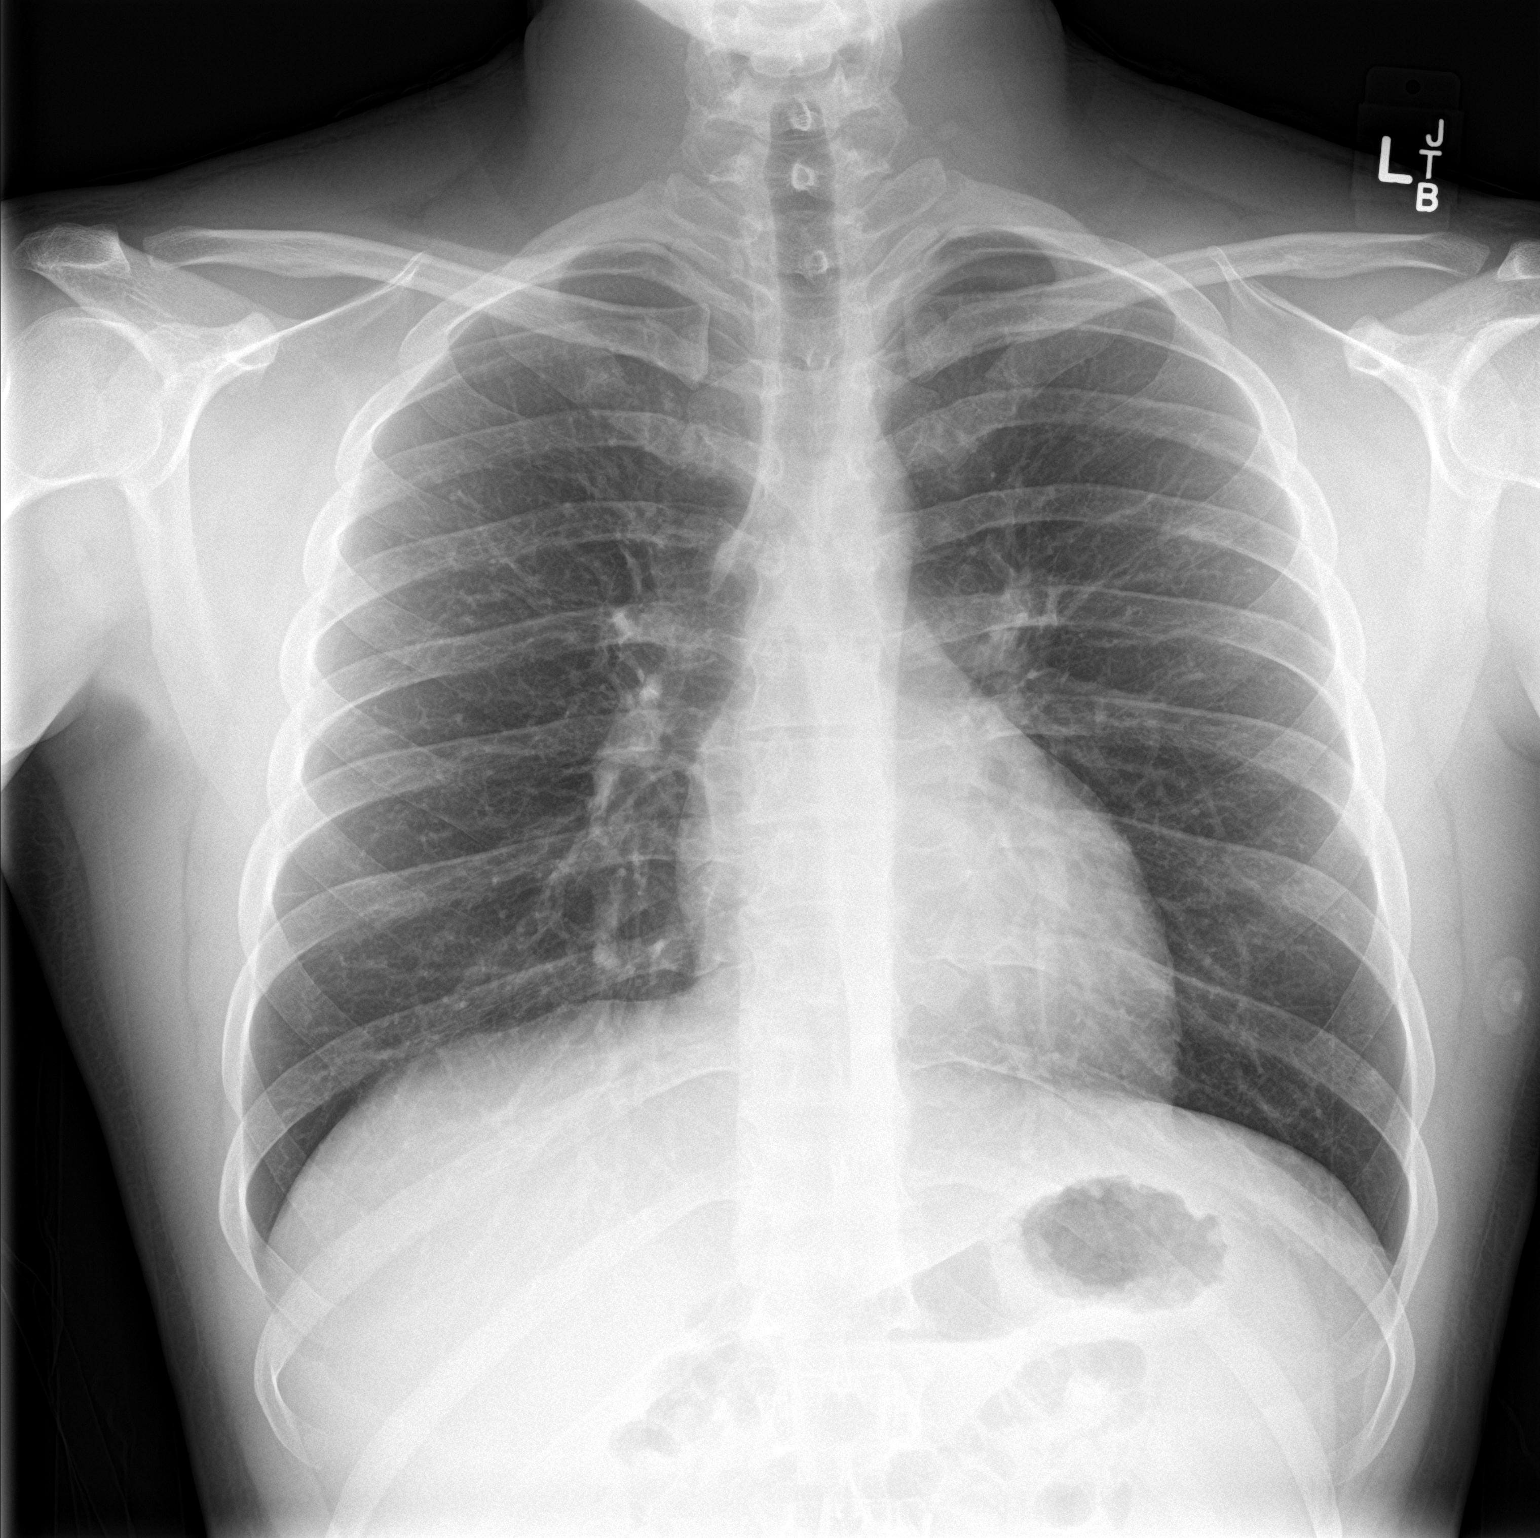

[chest lat]
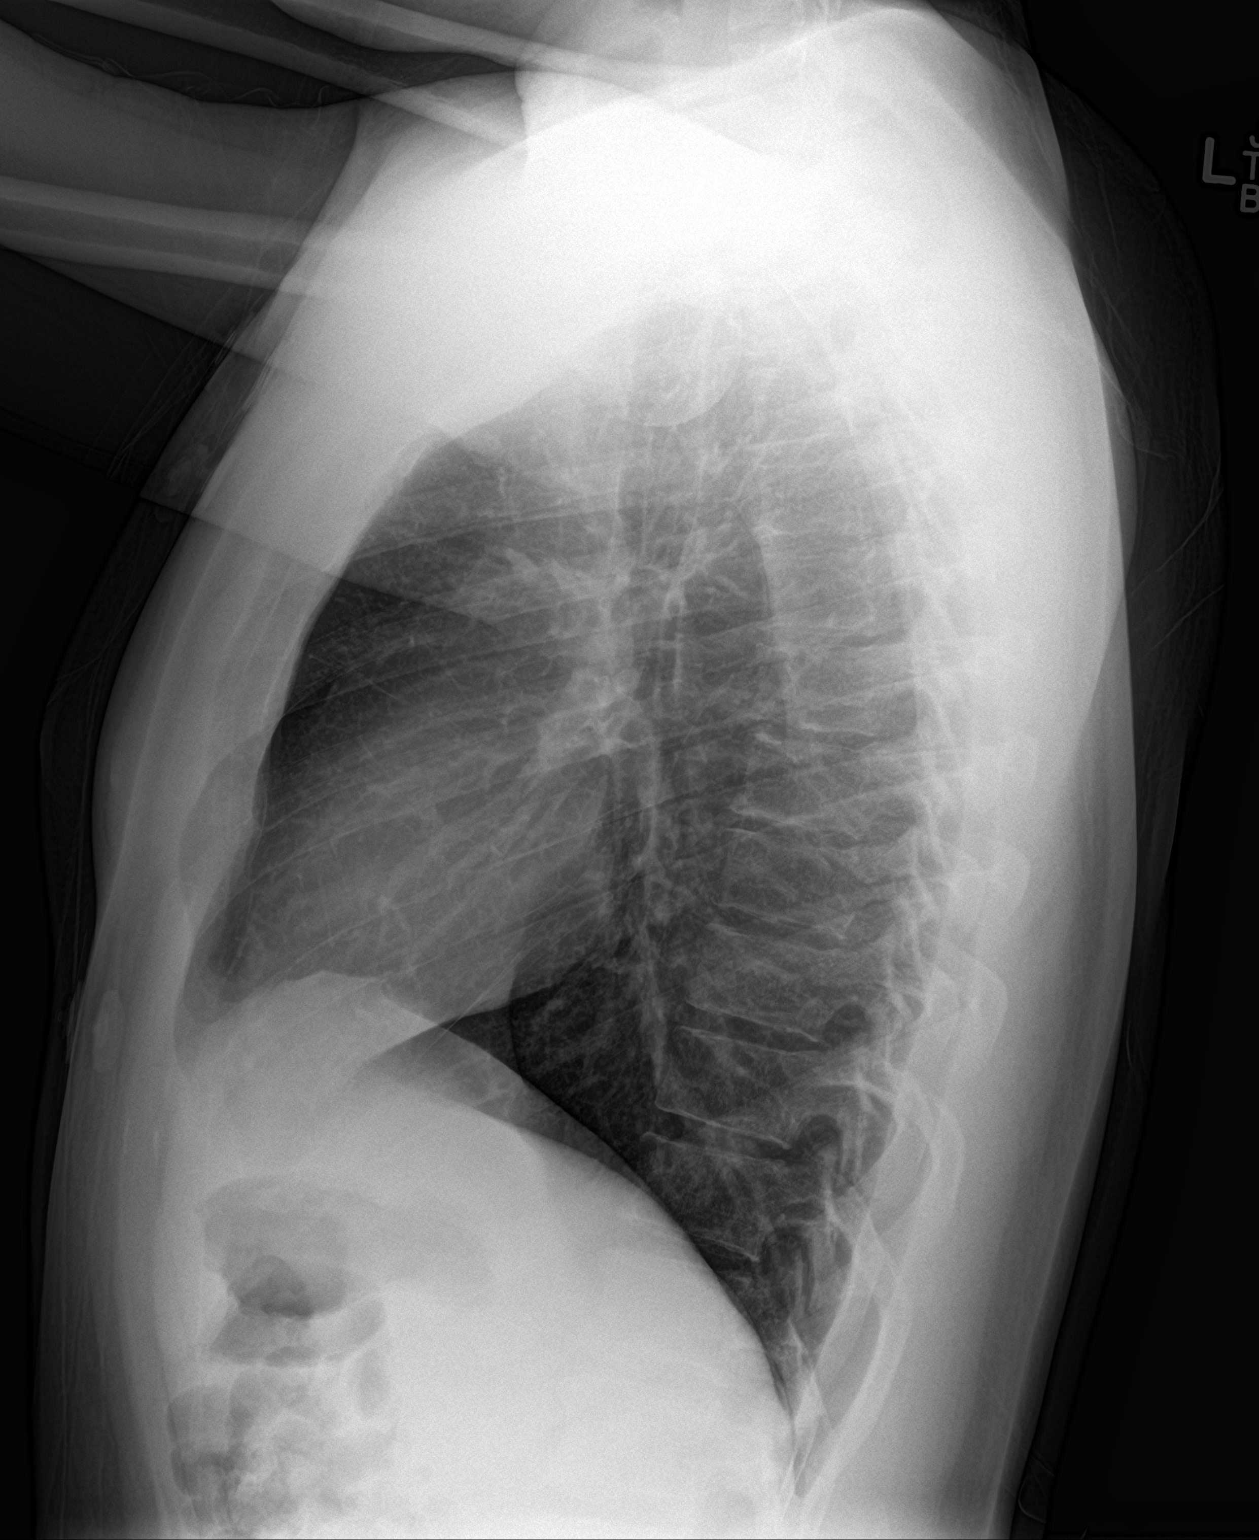

[2 of 2 positions shown; findings below may reference images not displayed]

FINDINGS: The heart size and mediastinal contours are within normal limits.
Both lungs are clear. The visualized skeletal structures are
unremarkable.
IMPRESSION: No active cardiopulmonary disease.  Stable appearance from priors.

## 2018-07-15 ENCOUNTER — Other Ambulatory Visit: Payer: Self-pay

## 2018-07-15 ENCOUNTER — Encounter (HOSPITAL_COMMUNITY): Payer: Self-pay

## 2018-07-15 ENCOUNTER — Encounter (HOSPITAL_COMMUNITY): Payer: Self-pay | Admitting: Student

## 2018-07-15 ENCOUNTER — Emergency Department (HOSPITAL_COMMUNITY)
Admission: EM | Admit: 2018-07-15 | Discharge: 2018-07-16 | Disposition: A | Discharge status: Home / Self Care | Payer: Self-pay | Attending: Emergency Medicine | Admitting: Emergency Medicine

## 2018-07-15 ENCOUNTER — Emergency Department (HOSPITAL_COMMUNITY)
Admission: EM | Admit: 2018-07-15 | Discharge: 2018-07-15 | Disposition: A | Discharge status: Home / Self Care | Payer: Self-pay | Attending: Emergency Medicine | Admitting: Emergency Medicine

## 2018-07-15 DIAGNOSIS — H9209 Otalgia, unspecified ear: Secondary | ICD-10-CM | POA: Insufficient documentation

## 2018-07-15 DIAGNOSIS — F17210 Nicotine dependence, cigarettes, uncomplicated: Secondary | ICD-10-CM | POA: Insufficient documentation

## 2018-07-15 DIAGNOSIS — I10 Essential (primary) hypertension: Secondary | ICD-10-CM | POA: Insufficient documentation

## 2018-07-15 DIAGNOSIS — H9203 Otalgia, bilateral: Secondary | ICD-10-CM | POA: Insufficient documentation

## 2018-07-15 DIAGNOSIS — Z5321 Procedure and treatment not carried out due to patient leaving prior to being seen by health care provider: Secondary | ICD-10-CM | POA: Insufficient documentation

## 2018-07-15 MED ORDER — NAPROXEN 375 MG PO TABS: 375.0000 mg | ORAL_TABLET | Freq: Two times a day (BID) | ORAL | 0 refills | Status: AC

## 2018-07-15 MED ORDER — AMOXICILLIN 500 MG PO CAPS: 500.0000 mg | ORAL_CAPSULE | Freq: Three times a day (TID) | ORAL | 0 refills | Status: DC

## 2018-07-15 MED ORDER — OFLOXACIN 0.3 % OT SOLN: 5.0000 [drp] | Freq: Every day | OTIC | 0 refills | Status: AC

## 2018-07-15 NOTE — ED Notes (Signed)
Patient verbalizes understanding of discharge instructions. Opportunity for questioning and answers were provided. Armband removed by staff, pt discharged from ED ambulatory.   

## 2018-07-15 NOTE — ED Triage Notes (Signed)
Pt states that he was seen here today for ear pain, given antibiotics that he could not afford, pt states that his ear were bleeding twice since leaving, no bleeding now

## 2018-07-15 NOTE — Discharge Instructions (Signed)
You are seen in the emergency department today for ear pain.  Your exam is somewhat concerning for an external and internal ear infection, for this reason we are sending you home with antibiotic drops as well as oral antibiotics.  Please use the ofloxacin drops 5 drops daily in each ear.  Please take the amoxicillin 3 times per day.  We have also given you naproxen to help with pain. - Naproxen is a nonsteroidal anti-inflammatory medication that will help with pain and swelling. Be sure to take this medication as prescribed with food, 1 pill every 12 hours,  It should be taken with food, as it can cause stomach upset, and more seriously, stomach bleeding. Do not take other nonsteroidal anti-inflammatory medications with this such as Advil, Motrin, Aleve, Mobic, Goodie Powder, or Motrin.  You may take Tylenol per over-the-counter dosing with this medication safely.  We have prescribed you new medication(s) today. Discuss the medications prescribed today with your pharmacist as they can have adverse effects and interactions with your other medicines including over the counter and prescribed medications. Seek medical evaluation if you start to experience new or abnormal symptoms after taking one of these medicines, seek care immediately if you start to experience difficulty breathing, feeling of your throat closing, facial swelling, or rash as these could be indications of a more serious allergic reaction  Please follow-up with your primary care provider within 3 to 5 days for reevaluation.  Return to the ER for new or worsening symptoms including but not limited to worsening pain, fevers, inability to hear, vomiting, redness/swelling behind the ear, or any other concerns.

## 2018-07-15 NOTE — ED Triage Notes (Signed)
Pt here today with bilateral ear pain. Pt states he just came home from the beach and thinks he has an ear infection or swimmers ear. Pt has no other pain complaints

## 2018-07-15 NOTE — ED Provider Notes (Signed)
[facility] EMERGENCY DEPARTMENT Provider Note   CSN: [id number] Arrival date & time: 07/15/18  9562     History   Chief Complaint Chief Complaint  Patient presents with  . Otalgia    HPI Howard Hayes is a 22 y.o. male with a hx of tobacco abuse and HTN who presents to the ED with complaints of bilateral ear pain that started 2 days ago. Patient states pain is constant, progressively worsening, L>R. Mild improvement with motrin/tylenol. No other specific alleviating/aggravating factors. Was recently at the beach doing a lot of swimming. He is concerned for ear infection. Reports associated congestion with tearfulness this AM secondary to discomfort. Current pain is a 7/10. Denies fever, sore throat, cough, change in hearing, or ear drainage.    HPI  Past Medical History:  Diagnosis Date  . Heart murmur   . Heart valve problem    unsure  . Hypertension     Patient Active Problem List   Diagnosis Date Noted  . 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      Home Medications    Prior to Admission medications   Medication Sig Start Date End Date Taking? Authorizing Provider  predniSONE (DELTASONE) 20 MG tablet Two daily with food 04/27/18   Elvina Sidle, MD    Family History No family history on file.  Social History Social History   Tobacco Use  . Smoking status: Current Every Day Smoker    Packs/day: 0.50    Types: Cigarettes  . Smokeless tobacco: Never Used  Substance Use Topics  . Alcohol use: Yes    Comment: occ   . Drug use: No     Allergies   Patient has no known allergies.   Review of Systems Review of Systems  Constitutional: Negative for chills and fever.  HENT: Positive for congestion and ear pain. Negative for ear discharge, hearing loss, sore throat, trouble swallowing and voice change.   Respiratory: Negative for cough and shortness of breath.      Physical Exam Updated Vital  Signs There were no vitals taken for this visit.  Physical Exam  Constitutional: He appears well-developed and well-nourished.  Non-toxic appearance. No distress.  HENT:  Head: Normocephalic and atraumatic.  Right Ear: There is drainage (mild amount, white discolored) and swelling (mild).  Left Ear: There is drainage (mild amount, white discolored) and swelling (mild).  Nose: Nose normal.  Mouth/Throat: Uvula is midline and oropharynx is clear and moist. No oropharyngeal exudate or posterior oropharyngeal erythema.  Tragal tenderness bilaterally. External ear without erythema or warmth. No mastoid tenderness/erythema/swelling. TM somewhat difficult to fully visualize bilaterally,  appear erythematous with bulging and loss of landmarks, no appreciable perforation in area of visualization.   Eyes: Conjunctivae are normal. Right eye exhibits no discharge. Left eye exhibits no discharge.  Neck: Normal range of motion. Neck supple. No neck rigidity. No edema and no erythema present.  Lymphadenopathy:    He has no cervical adenopathy.  Neurological: He is alert.  Psychiatric: He has a normal mood and affect. His behavior is normal. Thought content normal.  Nursing note and vitals reviewed.    ED Treatments / Results  Labs (all labs ordered are listed, but only abnormal results are displayed) Labs Reviewed - No data to display  EKG None  Radiology No results found.  Procedures Procedures (including critical care time)  Medications Ordered in ED Medications - No data to display   Initial Impression /  Assessment and Plan / ED Course  I have reviewed the triage vital signs and the nursing notes.  Pertinent labs & imaging results that were available during my care of the patient were reviewed by me and considered in my medical decision making (see chart for details).   Patient presents with bilateral ear pain. Patient nontoxic appearing, resting comfortably, vitals WNL with the  exception of elevated BP, doubt HTN emergency, patient aware of need for recheck. Exam concerning for bilateral AOM/EOM, difficult to fully visualize the TM secondary to mild swelling and anatomy, visible portions are without TM rupture. No indication of external cellulitis, mastoiditis, or meningitis. Will treat with ofloxacin drops as well as amoxicillin with PCP follow up. Naproxen prescription for pain.  I discussed treatment plan, need for PCP follow-up, and return precautions with the patient. Provided opportunity for questions, patient confirmed understanding and is in agreement with plan.    Final Clinical Impressions(s) / ED Diagnoses   Final diagnoses:  Otalgia of both ears    ED Discharge Orders         Ordered    naproxen (NAPROSYN) 375 MG tablet  2 times daily     07/15/18 1010    ofloxacin (FLOXIN) 0.3 % OTIC solution  Daily     07/15/18 1010    amoxicillin (AMOXIL) 500 MG capsule  3 times daily     07/15/18 1010           Melisa Donofrio, HarrisonSamantha R, PA-C 07/15/18 1016    Cathren LaineSteinl, Kevin, MD 07/15/18 1024

## 2018-07-15 NOTE — ED Notes (Signed)
ED Provider at bedside. 

## 2018-07-16 NOTE — ED Notes (Signed)
Pt was called to go back to a room and did not respond , unable to locate pt.

## 2018-07-16 NOTE — ED Notes (Signed)
Pt called for vitals, no response x3 

## 2018-07-16 NOTE — ED Provider Notes (Signed)
Patient called for room assignment but not found in waiting room.  I did not see or evaluate patient.   Sanders, Lisa M, PA-C 07/16/18 [id number]    Nira Connardama, Pedro Eduardo, MD 07/16/18 [id number]    Nira Connardama, Pedro Eduardo, MD 07/16/18 [phone]

## 2018-07-16 NOTE — ED Notes (Signed)
Pt. was called to go back to room and did not respond. Pt. name was moved back into waiting room. Will call in 10 mins. to see if Pt. Can be located.

## 2018-12-29 ENCOUNTER — Other Ambulatory Visit: Payer: Self-pay

## 2018-12-29 ENCOUNTER — Emergency Department (HOSPITAL_COMMUNITY)
Admission: EM | Admit: 2018-12-29 | Discharge: 2018-12-29 | Disposition: A | Discharge status: Home / Self Care | Payer: Self-pay | Attending: Emergency Medicine | Admitting: Emergency Medicine

## 2018-12-29 DIAGNOSIS — S61012A Laceration without foreign body of left thumb without damage to nail, initial encounter: Secondary | ICD-10-CM | POA: Insufficient documentation

## 2018-12-29 DIAGNOSIS — I10 Essential (primary) hypertension: Secondary | ICD-10-CM | POA: Insufficient documentation

## 2018-12-29 DIAGNOSIS — Y93G1 Activity, food preparation and clean up: Secondary | ICD-10-CM | POA: Insufficient documentation

## 2018-12-29 DIAGNOSIS — Y998 Other external cause status: Secondary | ICD-10-CM | POA: Insufficient documentation

## 2018-12-29 DIAGNOSIS — F17210 Nicotine dependence, cigarettes, uncomplicated: Secondary | ICD-10-CM | POA: Insufficient documentation

## 2018-12-29 DIAGNOSIS — Y9289 Other specified places as the place of occurrence of the external cause: Secondary | ICD-10-CM | POA: Insufficient documentation

## 2018-12-29 DIAGNOSIS — W260XXA Contact with knife, initial encounter: Secondary | ICD-10-CM | POA: Insufficient documentation

## 2018-12-29 MED ORDER — LIDOCAINE HCL (PF) 1 % IJ SOLN
20.0000 mL | Freq: Once | INTRAMUSCULAR | Status: DC
  Filled 2018-12-29: qty 20

## 2018-12-29 NOTE — Discharge Instructions (Addendum)
1. Medications: Tylenol or ibuprofen for pain 2. Treatment: ice for swelling, keep wound clean with warm soap and water  3. Follow Up: Please return in 10 days to have your stitches removed or sooner if you have concerns. Return to the emergency department for increased redness, drainage of pus from the wound   WOUND CARE  Remove bandage and wash wound gently with mild soap and warm water daily. Reapply a new bandage after cleaning wound  Continue daily cleansing with soap and water until stitches are removed.  Do not apply any ointments or creams to the wound while stitches are in place, as this may cause delayed healing. Return if you experience any of the following signs of infection: Swelling, redness, pus drainage, streaking, fever >101.0 F  Return if you experience excessive bleeding that does not stop after 15-20 minutes of constant, firm pressure.

## 2018-12-29 NOTE — ED Triage Notes (Signed)
Pt reports that he was at work and cut his left thumb with a knife while preparing food. Pt reports 5/10 throbbing pain in his left thumb.

## 2018-12-29 NOTE — ED Provider Notes (Signed)
[facility] EMERGENCY DEPARTMENT Provider Note   CSN: [id number] Arrival date & time: 12/29/18  1620     History   Chief Complaint Chief Complaint  Patient presents with  . thumb laceration    HPI Howard Hayes is a 23 y.o. male presenting for evaluation of thumb laceration.  Patient states approximately 1 hour prior to arrival he was at work cutting cooked bacon and chicken when he accidentally cut his left thumb.  Patient reports acute onset pain and bleeding.  He denies injury elsewhere.  He denies numbness or tingling.  He denies difficulty moving his thumb.  Tetanus is up-to-date.  He is not on any blood thinners.  He has not had anything for pain including Tylenol ibuprofen.  Patient reports a history of high blood pressure for which he supposed to take medication, but does not. Pt is L handed.  HPI  Past Medical History:  Diagnosis Date  . Heart murmur   . Heart valve problem    unsure  . Hypertension     Patient Active Problem List   Diagnosis Date Noted  . 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      Home Medications    Prior to Admission medications   Medication Sig Start Date End Date Taking? Authorizing Provider  amoxicillin (AMOXIL) 500 MG capsule Take 1 capsule (500 mg total) by mouth 3 (three) times daily. 07/15/18   Petrucelli, Samantha R, PA-C  naproxen (NAPROSYN) 375 MG tablet Take 1 tablet (375 mg total) by mouth 2 (two) times daily. 07/15/18   Petrucelli, Samantha R, PA-C  ofloxacin (FLOXIN) 0.3 % OTIC solution Place 5 drops into both ears daily. 07/15/18   Petrucelli, Pleas Koch, PA-C  predniSONE (DELTASONE) 20 MG tablet Two daily with food 04/27/18   Elvina Sidle, MD    Family History No family history on file.  Social History Social History   Tobacco Use  . Smoking status: Current Every Day Smoker    Packs/day: 0.50    Types: Cigarettes  . Smokeless tobacco: Never Used  Substance Use  Topics  . Alcohol use: Yes    Comment: occ   . Drug use: No     Allergies   Patient has no known allergies.   Review of Systems Review of Systems  Skin: Positive for wound.  Hematological: Does not bruise/bleed easily.     Physical Exam Updated Vital Signs BP (!) 148/89   Pulse (!) 105   Temp 97.8 F (36.6 C) (Oral)   Resp 16   Ht 5\' 7"  (1.702 m)   Wt 81.6 kg   SpO2 99%   BMI 28.19 kg/m   Physical Exam Vitals signs and nursing note reviewed.  Constitutional:      General: He is not in acute distress.    Appearance: He is well-developed.  HENT:     Head: Normocephalic and atraumatic.  Neck:     Musculoskeletal: Normal range of motion.  Pulmonary:     Effort: Pulmonary effort is normal.  Abdominal:     General: There is no distension.  Musculoskeletal: Normal range of motion.     Comments: Wound to the distal aspect of the left thumb.  Sensation intact.  Good cap refill.  Full active range of motion of the thumb without difficulty.  Skin:    General: Skin is warm.     Capillary Refill: Capillary refill takes less than 2 seconds.  Findings: No rash.     Comments: Approximately 1 cm laceration of the left thumb, ~ 2 mm deep. Minimal bleeding.   Neurological:     Mental Status: He is alert and oriented to person, place, and time.      ED Treatments / Results  Labs (all labs ordered are listed, but only abnormal results are displayed) Labs Reviewed - No data to display  EKG None  Radiology No results found.  Procedures .Marland KitchenLaceration Repair Date/Time: 12/29/2018 6:55 PM Performed by: Alveria Apley, PA-C Authorized by: Alveria Apley, PA-C   Consent:    Consent obtained:  Verbal   Consent given by:  Patient   Risks discussed:  Infection, poor cosmetic result, poor wound healing and need for additional repair Anesthesia (see MAR for exact dosages):    Anesthesia method:  Nerve block   Block location:  L thumb   Block needle gauge:  25  G   Block anesthetic:  Lidocaine 1% w/o epi   Block technique:  Ring block   Block injection procedure:  Anatomic landmarks identified, introduced needle, negative aspiration for blood and incremental injection   Block outcome:  Anesthesia achieved Laceration details:    Location:  Finger   Finger location:  L thumb   Length (cm):  1   Depth (mm):  2 Repair type:    Repair type:  Simple Pre-procedure details:    Preparation:  Patient was prepped and draped in usual sterile fashion Exploration:    Wound exploration: wound explored through full range of motion and entire depth of wound probed and visualized     Wound extent: no foreign bodies/material noted, no muscle damage noted, no nerve damage noted, no tendon damage noted and no vascular damage noted   Treatment:    Area cleansed with:  Soap and water   Amount of cleaning:  Standard   Irrigation solution:  Sterile water   Irrigation method:  Syringe Skin repair:    Repair method:  Sutures   Suture size:  4-0   Suture material:  Prolene   Suture technique:  Simple interrupted   Number of sutures:  3 Approximation:    Approximation:  Close Post-procedure details:    Dressing:  Sterile dressing   Patient tolerance of procedure:  Tolerated well, no immediate complications   (including critical care time)  Medications Ordered in ED Medications  lidocaine (PF) (XYLOCAINE) 1 % injection 20 mL (has no administration in time range)     Initial Impression / Assessment and Plan / ED Course  I have reviewed the triage vital signs and the nursing notes.  Pertinent labs & imaging results that were available during my care of the patient were reviewed by me and considered in my medical decision making (see chart for details).     Pt presenting for evaluation of thumb laceration.  Physical exam reassuring, he is neurovascularly intact.  Wound does not extend to the bony surface, doubt bony injury.  Wound irrigated extensively.   Repaired as described above.  Discussed aftercare instructions.  At this time, patient appears safe for discharge.  Return precautions given.  Patient states he understands agrees plan.  Final Clinical Impressions(s) / ED Diagnoses   Final diagnoses:  Laceration of left thumb without foreign body without damage to nail, initial encounter    ED Discharge Orders    None       Alveria Apley, PA-C 12/29/18 2239    Jacalyn Lefevre, MD 12/31/18 2317

## 2019-03-25 ENCOUNTER — Emergency Department (HOSPITAL_COMMUNITY): Payer: Self-pay

## 2019-03-25 ENCOUNTER — Other Ambulatory Visit: Payer: Self-pay

## 2019-03-25 ENCOUNTER — Emergency Department (HOSPITAL_COMMUNITY)
Admission: EM | Admit: 2019-03-25 | Discharge: 2019-03-25 | Disposition: A | Discharge status: Home / Self Care | Payer: Self-pay | Attending: Emergency Medicine | Admitting: Emergency Medicine

## 2019-03-25 ENCOUNTER — Encounter (HOSPITAL_COMMUNITY): Payer: Self-pay

## 2019-03-25 DIAGNOSIS — R05 Cough: Secondary | ICD-10-CM | POA: Insufficient documentation

## 2019-03-25 DIAGNOSIS — R079 Chest pain, unspecified: Secondary | ICD-10-CM | POA: Insufficient documentation

## 2019-03-25 DIAGNOSIS — R112 Nausea with vomiting, unspecified: Secondary | ICD-10-CM | POA: Insufficient documentation

## 2019-03-25 DIAGNOSIS — Z79899 Other long term (current) drug therapy: Secondary | ICD-10-CM | POA: Insufficient documentation

## 2019-03-25 DIAGNOSIS — R0602 Shortness of breath: Secondary | ICD-10-CM | POA: Insufficient documentation

## 2019-03-25 DIAGNOSIS — F17210 Nicotine dependence, cigarettes, uncomplicated: Secondary | ICD-10-CM | POA: Insufficient documentation

## 2019-03-25 HISTORY — DX: Anxiety disorder, unspecified: F41.9

## 2019-03-25 LAB — CBC
HCT: 45.6 % (ref 39.0–52.0)
Hemoglobin: 15.5 g/dL (ref 13.0–17.0)
MCH: 28.9 pg (ref 26.0–34.0)
MCHC: 34 g/dL (ref 30.0–36.0)
MCV: 85.1 fL (ref 80.0–100.0)
Platelets: 173 10*3/uL (ref 150–400)
RBC: 5.36 MIL/uL (ref 4.22–5.81)
RDW: 11.2 % — ABNORMAL LOW (ref 11.5–15.5)
WBC: 3.6 10*3/uL — ABNORMAL LOW (ref 4.0–10.5)
nRBC: 0 % (ref 0.0–0.2)

## 2019-03-25 LAB — COMPREHENSIVE METABOLIC PANEL
ALT: 35 U/L (ref 0–44)
AST: 26 U/L (ref 15–41)
Albumin: 4.2 g/dL (ref 3.5–5.0)
Alkaline Phosphatase: 69 U/L (ref 38–126)
Anion gap: 8 (ref 5–15)
BUN: 11 mg/dL (ref 6–20)
CO2: 26 mmol/L (ref 22–32)
Calcium: 8.9 mg/dL (ref 8.9–10.3)
Chloride: 103 mmol/L (ref 98–111)
Creatinine, Ser: 0.92 mg/dL (ref 0.61–1.24)
GFR calc Af Amer: >60 mL/min (ref >60)
GFR calc non Af Amer: >60 mL/min (ref >60)
Glucose, Bld: 101 mg/dL — ABNORMAL HIGH (ref 70–99)
Potassium: 3.8 mmol/L (ref 3.5–5.1)
Sodium: 137 mmol/L (ref 135–145)
Total Bilirubin: 0.6 mg/dL (ref 0.3–1.2)
Total Protein: 6.8 g/dL (ref 6.5–8.1)

## 2019-03-25 LAB — TROPONIN I: Troponin I: <0.03 ng/mL (ref <0.03)

## 2019-03-25 LAB — LIPASE, BLOOD: Lipase: 29 U/L (ref 11–51)

## 2019-03-25 MED ORDER — ONDANSETRON 8 MG PO TBDP: 8.0000 mg | ORAL_TABLET | Freq: Three times a day (TID) | ORAL | 0 refills | Status: AC | PRN

## 2019-03-25 MED ORDER — FAMOTIDINE 20 MG PO TABS: 20.0000 mg | ORAL_TABLET | Freq: Two times a day (BID) | ORAL | 0 refills | Status: AC

## 2019-03-25 NOTE — ED Notes (Signed)
ED Provider at bedside. 

## 2019-03-25 NOTE — ED Notes (Signed)
Patient verbalizes understanding of discharge instructions. Opportunity for questioning and answers were provided. Armband removed by staff, pt discharged from ED.  

## 2019-03-25 NOTE — ED Notes (Signed)
Xray tech at bedside.

## 2019-03-25 NOTE — ED Triage Notes (Signed)
Pt arrives POV from home c/o chest pain that started on Monday. Pt reports chest pain is mid to right side of chest with pain 7/10. Pt has cardiac hx and HTN since he was 23 yo; Pt states he has not "been taking his BP meds for a few years" due to costs of doctor visit and medication costs. Pt reports n/v daily.

## 2019-03-25 NOTE — ED Provider Notes (Signed)
[facility] EMERGENCY DEPARTMENT Provider Note   CSN: [id number] Arrival date & time: 03/25/19  1191    History   Chief Complaint Chief Complaint  Patient presents with  . Chest Pain    with n/v    HPI Destin Vinsant is a 23 y.o. male.  HPI: A 23 year old patient with a history of hypertension presents for evaluation of chest pain. Initial onset of pain was more than 6 hours ago. The patient's chest pain is sharp and is not worse with exertion. The patient complains of nausea. The patient's chest pain is middle- or left-sided, is not well-localized, is not described as heaviness/pressure/tightness and does not radiate to the arms/jaw/neck. The patient denies diaphoresis. The patient has no history of stroke, has no history of peripheral artery disease, has not smoked in the past 90 days, denies any history of treated diabetes, has no relevant family history of coronary artery disease (first degree relative at less than age 4), has no history of hypercholesterolemia and does not have an elevated BMI (>=30).   HPI Patient presents to the emergency room for evaluation of chest pain.  Patient states his symptoms started on Monday.  He is having intermittent episodes that last for minutes to hours.  It is an aching and sharp pain in the center of his chest.  It does not radiate.  He has had some episodes of nausea and he has vomited.  He has had some intermittent cough.  He denies any fevers.  He gets his temperature checked regularly at work and it has not been elevated this past week.  He has an occasional cough and occasionally feels short of breath but not at this moment.  No leg swelling.  No history of blood clots.  No history of coronary artery disease. Past Medical History:  Diagnosis Date  . Anxiety   . Heart murmur   . Heart valve problem    unsure  . Hypertension     Patient Active Problem List   Diagnosis Date Noted  . 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 No pertinent surgical history.      Home Medications    Prior to Admission medications   Medication Sig Start Date End Date Taking? Authorizing Provider  amoxicillin (AMOXIL) 500 MG capsule Take 1 capsule (500 mg total) by mouth 3 (three) times daily. 07/15/18   Petrucelli, Samantha R, PA-C  famotidine (PEPCID) 20 MG tablet Take 1 tablet (20 mg total) by mouth 2 (two) times daily. 03/25/19   Linwood Dibbles, MD  naproxen (NAPROSYN) 375 MG tablet Take 1 tablet (375 mg total) by mouth 2 (two) times daily. 07/15/18   Petrucelli, Samantha R, PA-C  ofloxacin (FLOXIN) 0.3 % OTIC solution Place 5 drops into both ears daily. 07/15/18   Petrucelli, Samantha R, PA-C  ondansetron (ZOFRAN ODT) 8 MG disintegrating tablet Take 1 tablet (8 mg total) by mouth every 8 (eight) hours as needed for nausea or vomiting. 03/25/19   Linwood Dibbles, MD  predniSONE (DELTASONE) 20 MG tablet Two daily with food 04/27/18   Elvina Sidle, MD    Family History History reviewed. No pertinent family history.  Social History Social History   Tobacco Use  . Smoking status: Current Every Day Smoker    Packs/day: 0.50    Types: Cigarettes  . Smokeless tobacco: Never Used  Substance Use Topics  . Alcohol use: Yes    Comment: occasionally   . Drug use: No  Allergies   Patient has no known allergies.   Review of Systems Review of Systems  All other systems reviewed and are negative.    Physical Exam Updated Vital Signs BP 129/89   Pulse 85   Temp 98.7 F (37.1 C) (Oral)   Resp 16   Ht 1.702 m (5\' 7" )   Wt 90.7 kg   SpO2 99%   BMI 31.32 kg/m   Physical Exam Vitals signs and nursing note reviewed.  Constitutional:      General: He is not in acute distress.    Appearance: He is well-developed.  HENT:     Head: Normocephalic and atraumatic.     Right Ear: External ear normal.     Left Ear: External ear normal.  Eyes:     General: No scleral icterus.       Right eye: No discharge.         Left eye: No discharge.     Conjunctiva/sclera: Conjunctivae normal.  Neck:     Musculoskeletal: Neck supple.     Trachea: No tracheal deviation.  Cardiovascular:     Rate and Rhythm: Normal rate and regular rhythm.  Pulmonary:     Effort: Pulmonary effort is normal. No respiratory distress.     Breath sounds: Normal breath sounds. No stridor. No wheezing or rales.  Abdominal:     General: Bowel sounds are normal. There is no distension.     Palpations: Abdomen is soft.     Tenderness: There is no abdominal tenderness. There is no guarding or rebound.  Musculoskeletal:        General: No tenderness.  Skin:    General: Skin is warm and dry.     Findings: No rash.  Neurological:     Mental Status: He is alert.     Cranial Nerves: No cranial nerve deficit (no facial droop, extraocular movements intact, no slurred speech).     Sensory: No sensory deficit.     Motor: No abnormal muscle tone or seizure activity.     Coordination: Coordination normal.      ED Treatments / Results  Labs (all labs ordered are listed, but only abnormal results are displayed) Labs Reviewed  CBC - Abnormal; Notable for the following components:      Result Value   WBC 3.6 (*)    RDW 11.2 (*)    All other components within normal limits  COMPREHENSIVE METABOLIC PANEL - Abnormal; Notable for the following components:   Glucose, Bld 101 (*)    All other components within normal limits  LIPASE, BLOOD  TROPONIN I    EKG EKG Interpretation  Date/Time:  Sunday March 25 2019 09:53:29 EDT Ventricular Rate:  94 PR Interval:    QRS Duration: 87 QT Interval:  347 QTC Calculation: 434 R Axis:   71 Text Interpretation:  Sinus rhythm Borderline repolarization abnormality No significant change since last tracing Confirmed by Linwood DibblesKnapp, Rhet Rorke [phone]([id number]) on 03/25/2019 10:01:42 AM   Radiology Dg Chest Portable 1 View  Result Date: 03/25/2019 CLINICAL DATA:  23 year old male with chest pain. History of early  onset hypertension. EXAM: PORTABLE CHEST 1 VIEW COMPARISON:  Prior chest x-ray 03/29/2018 FINDINGS: The lungs are clear and negative for focal airspace consolidation, pulmonary edema or suspicious pulmonary nodule. No pleural effusion or pneumothorax. Cardiac and mediastinal contours are within normal limits. No acute fracture or lytic or blastic osseous lesions. The visualized upper abdominal bowel gas pattern is unremarkable. IMPRESSION: Normal chest x-ray. Electronically Signed  By: Malachy Moan M.D.   On: 03/25/2019 10:16    Procedures Procedures (including critical care time)  Medications Ordered in ED Medications - No data to display   Initial Impression / Assessment and Plan / ED Course  I have reviewed the triage vital signs and the nursing notes.  Pertinent labs & imaging results that were available during my care of the patient were reviewed by me and considered in my medical decision making (see chart for details).     HEAR Score: 2  Patient presented to the emergency room for evaluation of chest pain.  Symptoms were atypical for acute coronary syndrome.  Patient is low risk work-up is reassuring.  I doubt ACS or other cardiac etiology.  No signs of pneumonia.  Low risk for PE.  Doubt pulmonary embolism.  Symptoms not suggestive of infectious etiology.  Doubt covid.  Patient has been having some episodes of nausea and vomiting.  Possible this could be related to gastroesophageal reflux.  Stran Becton was evaluated in Emergency Department on 03/25/2019 for the symptoms described in the history of present illness. He was evaluated in the context of the global COVID-19 pandemic, which necessitated consideration that the patient might be at risk for infection with the SARS-CoV-2 virus that causes COVID-19. Institutional protocols and algorithms that pertain to the evaluation of patients at risk for COVID-19 are in a state of rapid change based on information released by  regulatory bodies including the CDC and federal and state organizations. These policies and algorithms were followed during the patient's care in the ED.   Final Clinical Impressions(s) / ED Diagnoses   Final diagnoses:  Chest pain, unspecified type    ED Discharge Orders         Ordered    famotidine (PEPCID) 20 MG tablet  2 times daily     03/25/19 1217    ondansetron (ZOFRAN ODT) 8 MG disintegrating tablet  Every 8 hours PRN     03/25/19 1217           Linwood Dibbles, MD 03/25/19 1219

## 2019-03-25 NOTE — Discharge Instructions (Addendum)
Take the medications as prescribed is possible you may be having esophageal irritation that is causing your chest discomfort.  Follow-up with a primary care doctor to be rechecked if the symptoms have not improved.  Return as needed for worsening symptoms.

## 2019-04-13 ENCOUNTER — Encounter (HOSPITAL_BASED_OUTPATIENT_CLINIC_OR_DEPARTMENT_OTHER): Payer: Self-pay

## 2019-04-13 ENCOUNTER — Other Ambulatory Visit: Payer: Self-pay

## 2019-04-13 ENCOUNTER — Emergency Department (HOSPITAL_BASED_OUTPATIENT_CLINIC_OR_DEPARTMENT_OTHER): Payer: No Typology Code available for payment source

## 2019-04-13 ENCOUNTER — Emergency Department (HOSPITAL_BASED_OUTPATIENT_CLINIC_OR_DEPARTMENT_OTHER)
Admission: EM | Admit: 2019-04-13 | Discharge: 2019-04-13 | Disposition: A | Discharge status: Home / Self Care | Payer: No Typology Code available for payment source | Attending: Emergency Medicine | Admitting: Emergency Medicine

## 2019-04-13 DIAGNOSIS — Y990 Civilian activity done for income or pay: Secondary | ICD-10-CM | POA: Insufficient documentation

## 2019-04-13 DIAGNOSIS — Y9389 Activity, other specified: Secondary | ICD-10-CM | POA: Diagnosis not present

## 2019-04-13 DIAGNOSIS — W231XXA Caught, crushed, jammed, or pinched between stationary objects, initial encounter: Secondary | ICD-10-CM | POA: Insufficient documentation

## 2019-04-13 DIAGNOSIS — I10 Essential (primary) hypertension: Secondary | ICD-10-CM | POA: Insufficient documentation

## 2019-04-13 DIAGNOSIS — S8992XA Unspecified injury of left lower leg, initial encounter: Secondary | ICD-10-CM | POA: Diagnosis present

## 2019-04-13 DIAGNOSIS — F17210 Nicotine dependence, cigarettes, uncomplicated: Secondary | ICD-10-CM | POA: Insufficient documentation

## 2019-04-13 DIAGNOSIS — Y9259 Other trade areas as the place of occurrence of the external cause: Secondary | ICD-10-CM | POA: Insufficient documentation

## 2019-04-13 DIAGNOSIS — S8012XA Contusion of left lower leg, initial encounter: Secondary | ICD-10-CM | POA: Diagnosis not present

## 2019-04-13 MED ORDER — KETOROLAC TROMETHAMINE 60 MG/2ML IM SOLN
60.0000 mg | Freq: Once | INTRAMUSCULAR | Status: AC
  Administered 2019-04-13: 60 mg via INTRAMUSCULAR
  Filled 2019-04-13: qty 2

## 2019-04-13 MED ORDER — ACETAMINOPHEN 500 MG PO TABS
1000.0000 mg | ORAL_TABLET | Freq: Once | ORAL | Status: AC
  Administered 2019-04-13: 14:00:00 1000 mg via ORAL
  Filled 2019-04-13: qty 2

## 2019-04-13 NOTE — ED Notes (Signed)
Patient transported to X-ray 

## 2019-04-13 NOTE — ED Notes (Signed)
Mashed left lower leg between 2 power jacks  Pos pedal pulse

## 2019-04-13 NOTE — Discharge Instructions (Addendum)
Take Tylenol 1000 mg 4 times a day for 1 week. This is the maximum dose of Tylenol (acetaminophen) you can take from all sources. Please check other over-the-counter medications and prescriptions to ensure you are not taking other medications that contain acetaminophen.  You may also take ibuprofen 400 mg 6 times a day alternating with or at the same time as tylenol.  °

## 2019-04-13 NOTE — ED Provider Notes (Signed)
[facility] EMERGENCY DEPARTMENT Provider Note   CSN: [id number] Arrival date & time: 04/13/19  1323    History   Chief Complaint Chief Complaint  Patient presents with  . Leg Injury    HPI Howard Hayes is a 23 y.o. male.     HPI   23 year old male presents with concern for left lower extremity pain after his leg was caught between 2 jacks at work.  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  Reports he did fall down, but denies loss of consciousness or other injuries.  Reports the pain is 10 out of 10 in his left lower extremity.  No numbness or weakness, but significant pain with movement. Reports it was caught between them only briefly.  Pain is located directly in middle of tibia by small area of contusion, no other ankle pain o rknee pain. Does radiate up the tibia.    Past Medical History:  Diagnosis Date  . Anxiety   . Heart murmur   . Heart valve problem    unsure  . Hypertension     Patient Active Problem List   Diagnosis Date Noted  . 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 No pertinent surgical history.      Home Medications    Prior to Admission medications   Medication Sig Start Date End Date Taking? Authorizing Provider  amoxicillin (AMOXIL) 500 MG capsule Take 1 capsule (500 mg total) by mouth 3 (three) times daily. 07/15/18   Petrucelli, Samantha R, PA-C  famotidine (PEPCID) 20 MG tablet Take 1 tablet (20 mg total) by mouth 2 (two) times daily. 03/25/19   Linwood Dibbles, MD  naproxen (NAPROSYN) 375 MG tablet Take 1 tablet (375 mg total) by mouth 2 (two) times daily. 07/15/18   Petrucelli, Samantha R, PA-C  ofloxacin (FLOXIN) 0.3 % OTIC solution Place 5 drops into both ears daily. 07/15/18   Petrucelli, Samantha R, PA-C  ondansetron (ZOFRAN ODT) 8 MG disintegrating tablet Take 1 tablet (8 mg  total) by mouth every 8 (eight) hours as needed for nausea or vomiting. 03/25/19   Linwood Dibbles, MD  predniSONE (DELTASONE) 20 MG tablet Two daily with food 04/27/18   Elvina Sidle, MD    Family History No family history on file.  Social History Social History   Tobacco Use  . Smoking status: Current Every Day Smoker    Packs/day: 0.50    Types: Cigarettes  . Smokeless tobacco: Never Used  Substance Use Topics  . Alcohol use: Yes    Comment: occasionally   . Drug use: No     Allergies   Patient has no known allergies.   Review of Systems Review of Systems  Musculoskeletal: Positive for arthralgias, gait problem and myalgias.  Neurological: Negative for syncope, numbness and headaches.     Physical Exam Updated Vital Signs BP (!) 151/95 (BP Location: Right Arm)   Pulse 90   Temp 98.2 F (36.8 C) (Oral)   Resp 16   Ht 5\' 7"  (1.702 m)   Wt 90.7 kg   SpO2 100%   BMI 31.32 kg/m   Physical Exam Vitals signs and nursing note reviewed.  Constitutional:      General: He is not in acute distress.    Appearance: He is well-developed. He is not diaphoretic.  HENT:  Head: Normocephalic and atraumatic.  Eyes:     Conjunctiva/sclera: Conjunctivae normal.  Neck:     Musculoskeletal: Normal range of motion.  Cardiovascular:     Rate and Rhythm: Normal rate and regular rhythm.     Heart sounds: No murmur.  Pulmonary:     Effort: Pulmonary effort is normal. No respiratory distress.  Abdominal:     Tenderness: There is no guarding.  Musculoskeletal:     Left knee: He exhibits no swelling, no effusion and no ecchymosis.     Left ankle: He exhibits decreased range of motion. He exhibits no deformity, no laceration and normal pulse. No tenderness. No lateral malleolus, no medial malleolus, no AITFL, no head of 5th metatarsal and no proximal fibula tenderness found.     Left lower leg: He exhibits tenderness and bony tenderness. He exhibits no deformity and no  laceration.  Skin:    General: Skin is warm and dry.     Findings: Bruising (thin .75cm linear bruise/abrasion to mid-lower leg) present.  Neurological:     Mental Status: He is alert and oriented to person, place, and time.      ED Treatments / Results  Labs (all labs ordered are listed, but only abnormal results are displayed) Labs Reviewed - No data to display  EKG None  Radiology Dg Tibia/fibula Left  Result Date: 04/13/2019 CLINICAL DATA:  Crush injury to the right lower leg today. Pain and abrasions. EXAM: LEFT TIBIA AND FIBULA - 2 VIEW COMPARISON:  None. FINDINGS: There is no evidence of fracture or other focal bone lesions. Soft tissues are unremarkable. IMPRESSION: Negative. Electronically Signed   By: Francene BoyersJames  Maxwell M.D.   On: 04/13/2019 14:04    Procedures Procedures (including critical care time)  Medications Ordered in ED Medications  ketorolac (TORADOL) injection 60 mg (60 mg Intramuscular Given 04/13/19 1355)  acetaminophen (TYLENOL) tablet 1,000 mg (1,000 mg Oral Given 04/13/19 1355)     Initial Impression / Assessment and Plan / ED Course  I have reviewed the triage vital signs and the nursing notes.  Pertinent labs & imaging results that were available during my care of the patient were reviewed by me and considered in my medical decision making (see chart for details).       23 year old male presents with concern for left lower extremity pain after his leg was caught between 2 jacks at work.  Good pulses, NV intact.  Given toradol/tylenol for pain and XR ordered. Denies other injuries. No sign of compartment syndrome.     XR shows no sign of fracture.  Recommend ice, elevation.  Contusion etiology of pian, may be WBAT. Given crutches for comfort. Recommend ibuprofen/tylenol. Patient discharged in stable condition with understanding of reasons to return.   Final Clinical Impressions(s) / ED Diagnoses   Final diagnoses:  Contusion of left lower leg,  initial encounter    ED Discharge Orders    None       Alvira MondaySchlossman, Mumin Denomme, MD 04/13/19 1439

## 2019-04-13 NOTE — ED Triage Notes (Signed)
Pt c/o crush injury to left LE ~1230pm at work-NAD-to triage in w/c

## 2019-06-25 ENCOUNTER — Emergency Department (HOSPITAL_COMMUNITY)
Admission: EM | Admit: 2019-06-25 | Discharge: 2019-06-25 | Disposition: A | Discharge status: Home / Self Care | Payer: Managed Care, Other (non HMO) | Attending: Emergency Medicine | Admitting: Emergency Medicine

## 2019-06-25 ENCOUNTER — Other Ambulatory Visit: Payer: Self-pay

## 2019-06-25 ENCOUNTER — Encounter (HOSPITAL_COMMUNITY): Payer: Self-pay

## 2019-06-25 DIAGNOSIS — Z760 Encounter for issue of repeat prescription: Secondary | ICD-10-CM | POA: Diagnosis not present

## 2019-06-25 DIAGNOSIS — F17210 Nicotine dependence, cigarettes, uncomplicated: Secondary | ICD-10-CM | POA: Insufficient documentation

## 2019-06-25 DIAGNOSIS — Z79899 Other long term (current) drug therapy: Secondary | ICD-10-CM | POA: Insufficient documentation

## 2019-06-25 DIAGNOSIS — R079 Chest pain, unspecified: Secondary | ICD-10-CM | POA: Diagnosis not present

## 2019-06-25 DIAGNOSIS — I10 Essential (primary) hypertension: Secondary | ICD-10-CM | POA: Insufficient documentation

## 2019-06-25 LAB — BASIC METABOLIC PANEL
Anion gap: 7 (ref 5–15)
BUN: 9 mg/dL (ref 6–20)
CO2: 26 mmol/L (ref 22–32)
Calcium: 9.2 mg/dL (ref 8.9–10.3)
Chloride: 105 mmol/L (ref 98–111)
Creatinine, Ser: 0.93 mg/dL (ref 0.61–1.24)
GFR calc Af Amer: >60 mL/min (ref >60)
GFR calc non Af Amer: >60 mL/min (ref >60)
Glucose, Bld: 99 mg/dL (ref 70–99)
Potassium: 4.1 mmol/L (ref 3.5–5.1)
Sodium: 138 mmol/L (ref 135–145)

## 2019-06-25 MED ORDER — LISINOPRIL 5 MG PO TABS: 5.0000 mg | ORAL_TABLET | Freq: Every day | ORAL | 0 refills | Status: DC

## 2019-06-25 MED ORDER — LISINOPRIL 10 MG PO TABS
5.0000 mg | ORAL_TABLET | Freq: Once | ORAL | Status: AC
  Administered 2019-06-25: 5 mg via ORAL
  Filled 2019-06-25: qty 1

## 2019-06-25 NOTE — ED Provider Notes (Signed)
[facility] EMERGENCY DEPARTMENT Provider Note   CSN: [id number] Arrival date & time: 06/25/19  0910     History   Chief Complaint Chief Complaint  Patient presents with  . Hypertension    HPI Howard Hayes is a 23 y.o. male.     HPI   Patient is a 23 yo male with a PMH of anxiety, heart murmur, possible heart valve problem, HTN, presenting for need for blood pressure medication refill. Patient reports that he has taken lisinopril since he was 19. He ran out approximately a year ago and due to moving, he has not had a cardiologist or had this refilled. He reports being told by multiple providers that his BP is high. He reports that he will get brief, episodic chest pain that does not feel exacerbated by movement, activity, or any other stressor. Denies shortness of breath when he experiences CP. Denies history of DVT/PE, recent immobilization, or recent surgery.  Denies history of exertional syncope or diagnosis of HOCM.  Past Medical History:  Diagnosis Date  . Anxiety   . Heart murmur   . Heart valve problem    unsure  . Hypertension     Patient Active Problem List   Diagnosis Date Noted  . 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 No pertinent surgical history.      Home Medications    Prior to Admission medications   Medication Sig Start Date End Date Taking? Authorizing Provider  amoxicillin (AMOXIL) 500 MG capsule Take 1 capsule (500 mg total) by mouth 3 (three) times daily. 07/15/18   Petrucelli, Samantha R, PA-C  famotidine (PEPCID) 20 MG tablet Take 1 tablet (20 mg total) by mouth 2 (two) times daily. 03/25/19   Dorie Rank, MD  naproxen (NAPROSYN) 375 MG tablet Take 1 tablet (375 mg total) by mouth 2 (two) times daily. 07/15/18   Petrucelli, Samantha R, PA-C  ofloxacin (FLOXIN) 0.3 % OTIC solution Place 5 drops into both ears daily. 07/15/18   Petrucelli, Samantha R, PA-C  ondansetron (ZOFRAN ODT) 8 MG disintegrating tablet  Take 1 tablet (8 mg total) by mouth every 8 (eight) hours as needed for nausea or vomiting. 03/25/19   Dorie Rank, MD  predniSONE (DELTASONE) 20 MG tablet Two daily with food 04/27/18   Robyn Haber, MD    Family History History reviewed. No pertinent family history.  Social History Social History   Tobacco Use  . Smoking status: Current Every Day Smoker    Packs/day: 0.50    Types: Cigarettes  . Smokeless tobacco: Never Used  Substance Use Topics  . Alcohol use: Yes    Comment: occasionally   . Drug use: No     Allergies   Patient has no known allergies.   Review of Systems Review of Systems  Constitutional: Negative for chills and fever.  Respiratory: Negative for chest tightness and shortness of breath.   Cardiovascular: Negative for chest pain.  Gastrointestinal: Negative for abdominal pain, nausea and vomiting.  Neurological: Negative for dizziness, syncope and light-headedness.  All other systems reviewed and are negative.    Physical Exam Updated Vital Signs BP (!) 152/82 (BP Location: Right Arm)   Pulse 86   Temp 98.2 F (36.8 C) (Oral)   Resp 18   Ht 5\' 7"  (1.702 m)   Wt 92.1 kg   SpO2 96%   BMI 31.79 kg/m   Physical Exam Vitals signs and nursing note reviewed.  Constitutional:  General: He is not in acute distress.    Appearance: He is well-developed. He is not diaphoretic.     Comments: Sitting comfortably in bed.  HENT:     Head: Normocephalic and atraumatic.  Eyes:     General:        Right eye: No discharge.        Left eye: No discharge.     Conjunctiva/sclera: Conjunctivae normal.     Comments: EOMs normal to gross examination.  Neck:     Musculoskeletal: Normal range of motion.  Cardiovascular:     Rate and Rhythm: Normal rate and regular rhythm.     Pulses: Normal pulses.          Radial pulses are 2+ on the right side and 2+ on the left side.       Posterior tibial pulses are 2+ on the right side and 2+ on the left side.      Heart sounds: Normal heart sounds.  Pulmonary:     Effort: Pulmonary effort is normal.     Breath sounds: Normal breath sounds. No wheezing or rales.  Abdominal:     General: There is no distension.  Musculoskeletal: Normal range of motion.  Skin:    General: Skin is warm and dry.  Neurological:     Mental Status: He is alert.     Comments: Cranial nerves intact to gross observation. Patient moves extremities without difficulty.  Psychiatric:        Behavior: Behavior normal.        Thought Content: Thought content normal.        Judgment: Judgment normal.      ED Treatments / Results  Labs (all labs ordered are listed, but only abnormal results are displayed) Labs Reviewed  BASIC METABOLIC PANEL    EKG EKG Interpretation  Date/Time:  Monday June 25 2019 10:14:55 EDT Ventricular Rate:  76 PR Interval:    QRS Duration: 93 QT Interval:  386 QTC Calculation: 434 R Axis:   73 Text Interpretation:  Sinus rhythm No significant change since last tracing Confirmed by Melene PlanFloyd, Dan [phone]([id number]) on 06/25/2019 12:40:10 PM   Radiology No results found.  Procedures Procedures (including critical care time)  Medications Ordered in ED Medications - No data to display   Initial Impression / Assessment and Plan / ED Course  I have reviewed the triage vital signs and the nursing notes.  Pertinent labs & imaging results that were available during my care of the patient were reviewed by me and considered in my medical decision making (see chart for details).        Patient is a 23 yo male with a history of HTN presents to the ED for medication refill. He formerly took 10 mg of lisinopril but ran out a year ago. Records reviewed and it appears that consistently over the last year, patient has had consistently elevate BP readings in the 140s-150s systolic. No red flag symptoms surrounding patient's episodic CP, and he is not experiencing it today. BMP checked without abnormalities.  EKG unremarkable. Will prescribe reduced dose lisinopril at 5mg . Ambulatory referral to cardiology placed. Return precautions given for new or worsening symptoms.   Final Clinical Impressions(s) / ED Diagnoses   Final diagnoses:  Essential hypertension  Encounter for medication refill    ED Discharge Orders         Ordered    lisinopril (ZESTRIL) 5 MG tablet  Daily     06/25/19 1249  Ambulatory referral to Cardiology     06/25/19 1249           Delia ChimesMurray, Ayari Liwanag B, PA-C 06/25/19 1800    Melene PlanFloyd, Dan, DO 06/25/19 1839

## 2019-06-25 NOTE — Discharge Instructions (Signed)
Please see the information and instructions below regarding your visit.  Your diagnoses today include:  1. Essential hypertension   2. Encounter for medication refill     Tests performed today include: See side panel of your discharge paperwork for testing performed today. Vital signs are listed at the bottom of these instructions.   Medications prescribed:    Take any prescribed medications only as prescribed, and any over the counter medications only as directed on the packaging.  Starting tomorrow, please take lisinopril 1 tablet/day.  Home care instructions:  Please follow any educational materials contained in this packet.   Follow-up instructions: Please follow-up with cardiology in 3 weeks.  I placed a referral.  If you do not hear from them by the end of the week, please call the number listed.  Return instructions:  Please return to the Emergency Department if you experience worsening symptoms.  Please return if you have any other emergent concerns.  Additional Information:   Your vital signs today were: BP (!) 152/82 (BP Location: Right Arm)    Pulse 86    Temp 98.2 F (36.8 C) (Oral)    Resp 18    Ht 5\' 7"  (1.702 m)    Wt 92.1 kg    SpO2 96%    BMI 31.79 kg/m  If your blood pressure (BP) was elevated on multiple readings during this visit above 130 for the top number or above 80 for the bottom number, please have this repeated by your primary care provider within one month. --------------  Thank you for allowing [place] to participate in your care today.

## 2019-06-25 NOTE — ED Triage Notes (Signed)
Reports being on medication since he was 23 years old.  Has been out of lisinopril for over a year, has intermittent chest pain. Does not have a primary care or cardiologist in [place], his last dr were in [place] and he now has insurance.

## 2019-06-25 NOTE — ED Notes (Signed)
Patient reports he needs medication refill.  His concern its he has not had it over a year and can feel it has been high, so he would like to be referred to a PCP and a cardiologist.

## 2019-07-04 ENCOUNTER — Other Ambulatory Visit: Payer: Self-pay

## 2019-07-04 ENCOUNTER — Ambulatory Visit (HOSPITAL_COMMUNITY)
Admission: EM | Admit: 2019-07-04 | Discharge: 2019-07-04 | Disposition: A | Discharge status: Home / Self Care | Payer: Managed Care, Other (non HMO) | Attending: Emergency Medicine | Admitting: Emergency Medicine

## 2019-07-04 ENCOUNTER — Encounter (HOSPITAL_COMMUNITY): Payer: Self-pay

## 2019-07-04 DIAGNOSIS — M545 Low back pain, unspecified: Secondary | ICD-10-CM

## 2019-07-04 MED ORDER — IBUPROFEN 800 MG PO TABS: 800.0000 mg | ORAL_TABLET | Freq: Three times a day (TID) | ORAL | 0 refills | Status: DC

## 2019-07-04 MED ORDER — CYCLOBENZAPRINE HCL 5 MG PO TABS: 5.0000 mg | ORAL_TABLET | Freq: Two times a day (BID) | ORAL | 0 refills | Status: AC | PRN

## 2019-07-04 NOTE — Discharge Instructions (Signed)
Use anti-inflammatories for pain/swelling. You may take up to 800 mg Ibuprofen every 8 hours with food. You may supplement Ibuprofen with Tylenol [phone] mg every 8 hours.   You may use flexeril as needed to help with pain. This is a muscle relaxer and causes sedation- please use only at bedtime or when you will be home and not have to drive/work  Alternating ice and heat   No heavy lifting, avoid complete bed rest  Follow up if not resolving or worsening

## 2019-07-04 NOTE — ED Triage Notes (Signed)
Pt states he was helping his brother build a deck and he thinks he pulled a muscle. Pt states he needs a work note.

## 2019-07-04 NOTE — ED Provider Notes (Signed)
[facility]    CSN: [id number] Arrival date & time: 07/04/19  [id number]      History   Chief Complaint Chief Complaint  Patient presents with  . Muscle Pain    HPI Howard Hayes is a 23 y.o. male history of hypertension, presenting today for evaluation of back pain.  Patient states that he works at the [employer] and does a lot of heavy lifting.  Of recently has been transferred from groceries to frozen which is heavier lifting.  Yesterday when he was helping his brother build a deck he had a moment when he felt a pull in his left lower back.  Since he has had pain with movement and discomfort on his left lower back.  Denies radiation into legs.  Denies numbness or tingling.  Denies leg weakness.  Denies changes in control of urination or bowel movements.  He does not take any medicines for symptoms.  HPI  Past Medical History:  Diagnosis Date  . Anxiety   . Heart murmur   . Heart valve problem    unsure  . Hypertension     Patient Active Problem List   Diagnosis Date Noted  . 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 No pertinent surgical history.     Home Medications    Prior to Admission medications   Medication Sig Start Date End Date Taking? Authorizing Provider  amoxicillin (AMOXIL) 500 MG capsule Take 1 capsule (500 mg total) by mouth 3 (three) times daily. 07/15/18   Petrucelli, Samantha R, PA-C  cyclobenzaprine (FLEXERIL) 5 MG tablet Take 1-2 tablets (5-10 mg total) by mouth 2 (two) times daily as needed for muscle spasms. 07/04/19   Delando Satter C, PA-C  famotidine (PEPCID) 20 MG tablet Take 1 tablet (20 mg total) by mouth 2 (two) times daily. 03/25/19   Linwood DibblesKnapp, Jon, MD  ibuprofen (ADVIL) 800 MG tablet Take 1 tablet (800 mg total) by mouth 3 (three) times daily. 07/04/19   Kayin Kettering C, PA-C  lisinopril (ZESTRIL) 5 MG tablet Take 1 tablet (5 mg total) by mouth daily. 06/25/19   Aviva KluverMurray, Alyssa B, PA-C  naproxen  (NAPROSYN) 375 MG tablet Take 1 tablet (375 mg total) by mouth 2 (two) times daily. 07/15/18   Petrucelli, Samantha R, PA-C  ofloxacin (FLOXIN) 0.3 % OTIC solution Place 5 drops into both ears daily. 07/15/18   Petrucelli, Samantha R, PA-C  ondansetron (ZOFRAN ODT) 8 MG disintegrating tablet Take 1 tablet (8 mg total) by mouth every 8 (eight) hours as needed for nausea or vomiting. 03/25/19   Linwood DibblesKnapp, Jon, MD  predniSONE (DELTASONE) 20 MG tablet Two daily with food 04/27/18   Elvina SidleLauenstein, Kurt, MD    Family History History reviewed. No pertinent family history.  Social History Social History   Tobacco Use  . Smoking status: Current Every Day Smoker    Packs/day: 0.50    Types: Cigarettes  . Smokeless tobacco: Never Used  Substance Use Topics  . Alcohol use: Yes    Comment: occasionally   . Drug use: No     Allergies   Patient has no known allergies.   Review of Systems Review of Systems  Constitutional: Negative for fatigue and fever.  Eyes: Negative for redness, itching and visual disturbance.  Respiratory: Negative for shortness of breath.   Cardiovascular: Negative for chest pain and leg swelling.  Gastrointestinal: Negative for nausea and vomiting.  Genitourinary: Negative for decreased urine volume and difficulty urinating.  Musculoskeletal:  Positive for back pain and myalgias. Negative for arthralgias.  Skin: Negative for color change, rash and wound.  Neurological: Negative for dizziness, syncope, weakness, light-headedness and headaches.     Physical Exam Triage Vital Signs ED Triage Vitals  Enc Vitals Group     BP 07/04/19 1002 125/66     Pulse Rate 07/04/19 1002 91     Resp 07/04/19 1002 16     Temp 07/04/19 1002 98.4 F (36.9 C)     Temp Source 07/04/19 1002 Temporal     SpO2 07/04/19 1002 100 %     Weight 07/04/19 1015 200 lb (90.7 kg)     Height --      Head Circumference --      Peak Flow --      Pain Score 07/04/19 1014 6     Pain Loc --      Pain  Edu? --      Excl. in [place]? --    No data found.  Updated Vital Signs BP 125/66 (BP Location: Left Arm)   Pulse 91   Temp 98.4 F (36.9 C) (Temporal)   Resp 16   Wt 200 lb (90.7 kg)   SpO2 100%   BMI 31.32 kg/m   Visual Acuity Right Eye Distance:   Left Eye Distance:   Bilateral Distance:    Right Eye Near:   Left Eye Near:    Bilateral Near:     Physical Exam Vitals signs and nursing note reviewed.  Constitutional:      Appearance: He is well-developed.  HENT:     Head: Normocephalic and atraumatic.  Eyes:     Conjunctiva/sclera: Conjunctivae normal.  Neck:     Musculoskeletal: Neck supple.  Cardiovascular:     Rate and Rhythm: Normal rate and regular rhythm.     Heart sounds: No murmur.  Pulmonary:     Effort: Pulmonary effort is normal. No respiratory distress.     Breath sounds: Normal breath sounds.  Abdominal:     Palpations: Abdomen is soft.     Tenderness: There is no abdominal tenderness.  Musculoskeletal:     Comments: Sitting with most of weight on right side, nontender to palpation of cervical, thoracic and lumbar spine midline, no palpable deformity or step-off, increased tenderness throughout left paraspinal and lumbar musculature  Strength at hips 5/5 and equal bilaterally, knee strength 5/5 and equal bilaterally Patellar reflex 2+ bilaterally  Gait without abnormality  Skin:    General: Skin is warm and dry.  Neurological:     Mental Status: He is alert.      UC Treatments / Results  Labs (all labs ordered are listed, but only abnormal results are displayed) Labs Reviewed - No data to display  EKG   Radiology No results found.  Procedures Procedures (including critical care time)  Medications Ordered in UC Medications - No data to display  Initial Impression / Assessment and Plan / UC Course  I have reviewed the triage vital signs and the nursing notes.  Pertinent labs & imaging results that were available during my care of  the patient were reviewed by me and considered in my medical decision making (see chart for details).     Likely lumbar strain of left side.  Do not suspect bony abnormality.  No neuro deficits, do not suspect cauda equina.  Recommend anti-inflammatories and muscle relaxers, activity modification.  Recommendations below.  Provided work note for light duty x10 days.Discussed strict return precautions.  Patient verbalized understanding and is agreeable with plan.  Final Clinical Impressions(s) / UC Diagnoses   Final diagnoses:  Acute left-sided low back pain without sciatica     Discharge Instructions     Use anti-inflammatories for pain/swelling. You may take up to 800 mg Ibuprofen every 8 hours with food. You may supplement Ibuprofen with Tylenol [phone] mg every 8 hours.   You may use flexeril as needed to help with pain. This is a muscle relaxer and causes sedation- please use only at bedtime or when you will be home and not have to drive/work  Alternating ice and heat   No heavy lifting, avoid complete bed rest  Follow up if not resolving or worsening   ED Prescriptions    Medication Sig Dispense Auth. Provider   ibuprofen (ADVIL) 800 MG tablet Take 1 tablet (800 mg total) by mouth 3 (three) times daily. 21 tablet Arianah Torgeson C, PA-C   cyclobenzaprine (FLEXERIL) 5 MG tablet Take 1-2 tablets (5-10 mg total) by mouth 2 (two) times daily as needed for muscle spasms. 24 tablet Waynesha Rammel, [place] C, PA-C     Controlled Substance Prescriptions [place] Controlled Substance Registry consulted? Not Applicable   Lew DawesWieters, Dezaray Shibuya C, [place]PA-C 07/04/19 1039

## 2019-08-09 ENCOUNTER — Other Ambulatory Visit: Payer: Self-pay

## 2019-08-09 ENCOUNTER — Encounter: Payer: Self-pay | Admitting: Cardiovascular Disease

## 2019-08-09 ENCOUNTER — Ambulatory Visit (INDEPENDENT_AMBULATORY_CARE_PROVIDER_SITE_OTHER): Payer: Managed Care, Other (non HMO) | Admitting: Cardiovascular Disease

## 2019-08-09 VITALS — BP 102/68 | HR 71 | Temp 98.1°F | Ht 67.0 in | Wt 198.6 lb

## 2019-08-09 DIAGNOSIS — R011 Cardiac murmur, unspecified: Secondary | ICD-10-CM

## 2019-08-09 DIAGNOSIS — Z720 Tobacco use: Secondary | ICD-10-CM | POA: Diagnosis not present

## 2019-08-09 DIAGNOSIS — R9431 Abnormal electrocardiogram [ECG] [EKG]: Secondary | ICD-10-CM

## 2019-08-09 DIAGNOSIS — I10 Essential (primary) hypertension: Secondary | ICD-10-CM | POA: Diagnosis not present

## 2019-08-09 MED ORDER — LISINOPRIL 5 MG PO TABS: 5.0000 mg | ORAL_TABLET | Freq: Every day | ORAL | 3 refills | Status: AC

## 2019-08-09 NOTE — Progress Notes (Signed)
Cardiology Office Note:   Date:  08/09/2019  NAME:  Howard Hayes    MRN: [id number] DOB:  06/01/1996   PCP:  Patient, No Pcp Per  Cardiologist:  No primary care provider on file.  Electrophysiologist:  None   Referring MD: Howard Hayes, Howard B, PA-C   Chief Complaint  Patient presents with  . Heart Murmur   History of Present Illness:   Howard Hayes is a 23 y.o. male with a hx of hypertension who is being seen today for the evaluation of cardiac murmur, hypertension at the request of Howard KluverAlyssa Murray, PA-C.  He was seen in the emergency room at the end of July, referred to us for management of history of hypertension and cardiac murmur.  He reports he was told when he was 23 years of age he had high blood pressure, and he states an echocardiogram was obtained by pediatric cardiologist and this was reported to be abnormal.  I taught him about the fact that pediatric cardiologist routinely use incorrect measurements for patients as they approach adulthood.  His EKG is extremely normal without any suggestion of LVH or prior infarction.  He reports he works at [employer] and this is quite vigorous activity of lifting boxes and heavy items.  He is able to do this without limitations.  His cardiovascular risk factors include about three-quarter pack of cigarettes daily.  He has smoked for several years since the age of 23, 6 years.  His current medications include lisinopril 5 mg daily.  His blood pressure is well controlled in office today, 102/68.  He has no history of diabetes, and he is never had a heart attack or stroke.  He reports significant stress in his life with the death of 2 nephews recently.  Reports a nephew committed suicide nearly 1 year ago and this is quite bothersome to him as rightfully so.  He also reports 1 of his nephews was murdered, and this assailant has recently gone to jail, apparently this assailant was a family member.  Past Medical History: Past Medical  History:  Diagnosis Date  . Anxiety   . Heart murmur   . Heart valve problem    unsure  . Hypertension     Past Surgical History: Past Surgical History:  Procedure Laterality Date  . LEG SURGERY      Current Medications: Current Meds  Medication Sig  . lisinopril (ZESTRIL) 5 MG tablet Take 1 tablet (5 mg total) by mouth daily.  . [DISCONTINUED] lisinopril (ZESTRIL) 5 MG tablet Take 1 tablet (5 mg total) by mouth daily.     Allergies:    Patient has no known allergies.   Social History: Social History   Socioeconomic History  . Marital status: Single    Spouse name: Not on file  . Number of children: Not on file  . Years of education: Not on file  . Highest education level: Not on file  Occupational History  . Not on file  Social Needs  . Financial resource strain: Not on file  . Food insecurity    Worry: Not on file    Inability: Not on file  . Transportation needs    Medical: Not on file    Non-medical: Not on file  Tobacco Use  . Smoking status: Current Every Day Smoker    Packs/day: 1.50    Years: 11.00    Pack years: 16.50    Types: Cigarettes  . Smokeless tobacco: Never Used  Substance and  Sexual Activity  . Alcohol use: Yes    Comment: occasionally   . Drug use: No  . Sexual activity: Not on file  Lifestyle  . Physical activity    Days per week: Not on file    Minutes per session: Not on file  . Stress: Not on file  Relationships  . Social Musician on phone: Not on file    Gets together: Not on file    Attends religious service: Not on file    Active member of club or organization: Not on file    Attends meetings of clubs or organizations: Not on file    Relationship status: Not on file  Other Topics Concern  . Not on file  Social History Narrative  . Not on file     Family History: The patient's family history includes Heart disease in his mother.  ROS:   All other ROS reviewed and negative. Pertinent positives noted in  the HPI.     EKGs/Labs/Other Studies Reviewed:   The following studies were personally reviewed by me today:  EKG:  EKG is ordered today.  The ekg ordered today demonstrates normal sinus rhythm, heart rate 65, normal intervals, no evidence of acute ST-T changes, no prior infarction, normal EKG, and was personally reviewed by me.   Recent Labs: 03/25/2019: ALT 35; Hemoglobin 15.5; Platelets 173 06/25/2019: BUN 9; Creatinine, Ser 0.93; Potassium 4.1; Sodium 138   Recent Lipid Panel No results found for: CHOL, TRIG, HDL, CHOLHDL, VLDL, LDLCALC, LDLDIRECT  Physical Exam:   VS:  BP 102/68   Pulse 71   Temp 98.1 F (36.7 C)   Ht 5\' 7"  (1.702 m)   Wt 198 lb 9.6 oz (90.1 kg)   SpO2 97%   BMI 31.11 kg/m    Wt Readings from Last 3 Encounters:  08/09/19 198 lb 9.6 oz (90.1 kg)  07/04/19 200 lb (90.7 kg)  06/25/19 203 lb (92.1 kg)    General: Well nourished, well developed, in no acute distress Heart: Atraumatic, normal size  Eyes: PEERLA, EOMI  Neck: Supple, no JVD Endocrine: No thryomegaly Cardiac: Normal S1, S2; RRR; no murmurs, rubs, or gallops Lungs: Clear to auscultation bilaterally, no wheezing, rhonchi or rales  Abd: Soft, nontender, no hepatomegaly  Ext: No edema, pulses 2+ Musculoskeletal: No deformities, BUE and BLE strength normal and equal Skin: Warm and dry, no rashes   Neuro: Alert and oriented to person, place, time, and situation, CNII-XII grossly intact, no focal deficits  Psych: Normal mood and affect   ASSESSMENT:   NAME@ is a 23 y.o. male who presents for the following: 1. Murmur   2. Nonspecific abnormal electrocardiogram (ECG) (EKG)   3. Tobacco abuse   4. Essential hypertension     PLAN:   1. Murmur 2. Nonspecific abnormal electrocardiogram (ECG) (EKG) -No evidence of a cardiac murmur on examination, I suspect he had a venous hum in childhood.  This likely was benign murmur of childhood.  Due to his normal EKG, I see no need for an echocardiogram at  this time.  3. Tobacco abuse -Counseled on the importance of smoking cessation to maintain overall cardiovascular health and for life.  I also informed him this will help with blood pressure.  4. Essential hypertension -His blood pressure is well controlled on 5 mg of lisinopril.  Refill provided today.  I suspect his high blood pressure is related to anxiety/stress, and smoking.  I see no strong reason to work-up  secondary causes of hypertension at this time.  Given that he is well controlled on one medication, and likely has 2 identifiable sources of elevated blood pressure (smoking, anxiety), I discussed with him that extensive work-up for secondary causes is likely of little utility currently.  He was in agreement with this plan.  I think overall, he can follow-up with a primary care physician for management of his issues, as I see no need for him to see a specialist.  Disposition: Return if symptoms worsen or fail to improve.  Medication Adjustments/Labs and Tests Ordered: Current medicines are reviewed at length with the patient today.  Concerns regarding medicines are outlined above.  Orders Placed This Encounter  Procedures  . EKG 12-Lead   Meds ordered this encounter  Medications  . lisinopril (ZESTRIL) 5 MG tablet    Sig: Take 1 tablet (5 mg total) by mouth daily.    Dispense:  90 tablet    Refill:  3    There are no Patient Instructions on file for this visit.   Signed, Addison Naegeli. Audie Box, [place]  [address] [place], [place] [address] [phone]  08/09/2019 8:38 AM

## 2019-08-09 NOTE — Patient Instructions (Signed)
Medication Instructions:  Continue same medications   Lab work: None ordered   Testing/Procedures: None ordered  Follow-Up: At [facility], you and your health needs are our priority.  As part of our continuing mission to provide you with exceptional heart care, we have created designated Provider Care Teams.  These Care Teams include your primary Cardiologist (physician) and Advanced Practice Providers (APPs -  Physician Assistants and Nurse Practitioners) who all work together to provide you with the care you need, when you need it. . Follow Up as needed . Establish with a primary care Dr. [facility] [phone]

## 2019-11-26 ENCOUNTER — Other Ambulatory Visit: Payer: Self-pay

## 2019-11-26 ENCOUNTER — Ambulatory Visit (HOSPITAL_COMMUNITY)
Admission: EM | Admit: 2019-11-26 | Discharge: 2019-11-26 | Discharge status: Against Medical Advice | Payer: Managed Care, Other (non HMO)

## 2019-11-26 NOTE — ED Notes (Signed)
Per Wells Guiles in patient access, pt never answered when called for triage.

## 2020-03-14 ENCOUNTER — Encounter (HOSPITAL_COMMUNITY): Payer: Self-pay

## 2020-03-14 ENCOUNTER — Ambulatory Visit (HOSPITAL_COMMUNITY)
Admission: EM | Admit: 2020-03-14 | Discharge: 2020-03-14 | Disposition: A | Discharge status: Home / Self Care | Payer: Managed Care, Other (non HMO) | Attending: Physician Assistant | Admitting: Physician Assistant

## 2020-03-14 ENCOUNTER — Other Ambulatory Visit: Payer: Self-pay

## 2020-03-14 DIAGNOSIS — S0501XA Injury of conjunctiva and corneal abrasion without foreign body, right eye, initial encounter: Secondary | ICD-10-CM

## 2020-03-14 MED ORDER — FLUORESCEIN SODIUM 1 MG OP STRP
ORAL_STRIP | OPHTHALMIC | Status: AC
  Filled 2020-03-14: qty 1

## 2020-03-14 MED ORDER — POLYMYXIN B-TRIMETHOPRIM 10000-0.1 UNIT/ML-% OP SOLN: 2.0000 [drp] | OPHTHALMIC | 0 refills | Status: AC

## 2020-03-14 MED ORDER — TETRACAINE HCL 0.5 % OP SOLN
OPHTHALMIC | Status: AC
  Filled 2020-03-14: qty 4

## 2020-03-14 MED ORDER — HYPROMELLOSE (GONIOSCOPIC) 2.5 % OP SOLN: 1.0000 [drp] | OPHTHALMIC | 12 refills | Status: AC | PRN

## 2020-03-14 MED ORDER — IBUPROFEN 800 MG PO TABS: 800.0000 mg | ORAL_TABLET | Freq: Three times a day (TID) | ORAL | 0 refills | Status: AC
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
## 2020-03-14 NOTE — Discharge Instructions (Addendum)
Use eyedrops as directed Take the ibuprofen for pain  If symptoms are severely worsening over the weekend please report to the emergency department, such that if he had worsening vision, severe eye pain.  I would like for you to consider follow-up with the ophthalmologist if you are still having symptoms through the weekend.  So give them a call Monday morning if you like your eye further evaluated, though if you are improving I do feel you can wait for the middle of the week.  However he have blurry vision or any continued eye pain please call them.
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
## 2020-04-30 ENCOUNTER — Other Ambulatory Visit: Payer: Self-pay

## 2020-04-30 ENCOUNTER — Emergency Department (HOSPITAL_COMMUNITY): Payer: Self-pay

## 2020-04-30 ENCOUNTER — Encounter (HOSPITAL_COMMUNITY): Payer: Self-pay

## 2020-04-30 ENCOUNTER — Emergency Department (HOSPITAL_COMMUNITY)
Admission: EM | Admit: 2020-04-30 | Discharge: 2020-04-30 | Discharge status: Against Medical Advice | Payer: Self-pay | Attending: Emergency Medicine | Admitting: Emergency Medicine

## 2020-04-30 DIAGNOSIS — Y929 Unspecified place or not applicable: Secondary | ICD-10-CM | POA: Insufficient documentation

## 2020-04-30 DIAGNOSIS — W28XXXA Contact with powered lawn mower, initial encounter: Secondary | ICD-10-CM | POA: Insufficient documentation

## 2020-04-30 DIAGNOSIS — Y999 Unspecified external cause status: Secondary | ICD-10-CM | POA: Insufficient documentation

## 2020-04-30 DIAGNOSIS — Y939 Activity, unspecified: Secondary | ICD-10-CM | POA: Insufficient documentation

## 2020-04-30 DIAGNOSIS — Z5321 Procedure and treatment not carried out due to patient leaving prior to being seen by health care provider: Secondary | ICD-10-CM | POA: Insufficient documentation

## 2020-04-30 DIAGNOSIS — S61411A Laceration without foreign body of right hand, initial encounter: Secondary | ICD-10-CM | POA: Insufficient documentation

## 2020-04-30 NOTE — ED Triage Notes (Signed)
Patient complains of right hand ring and middle finger tips of fingers lacs after sticking hand under lawn mower. Minimal bleeding, complains of burning to same. Alert and orinted

## 2020-04-30 NOTE — ED Notes (Signed)
No answer for vitals recheck x1 

## 2021-01-14 ENCOUNTER — Encounter (HOSPITAL_COMMUNITY): Payer: Self-pay

## 2021-01-14 ENCOUNTER — Other Ambulatory Visit: Payer: Self-pay

## 2021-01-14 ENCOUNTER — Ambulatory Visit (HOSPITAL_COMMUNITY)
Admission: EM | Admit: 2021-01-14 | Discharge: 2021-01-14 | Disposition: A | Discharge status: Home / Self Care | Payer: Self-pay | Attending: Emergency Medicine | Admitting: Emergency Medicine

## 2021-01-14 DIAGNOSIS — H6691 Otitis media, unspecified, right ear: Secondary | ICD-10-CM

## 2021-01-14 MED ORDER — NEOMYCIN-POLYMYXIN-HC 3.5-10000-1 OT SUSP: 3.0000 [drp] | Freq: Three times a day (TID) | OTIC | 0 refills | Status: AC

## 2021-01-14 MED ORDER — CEFDINIR 300 MG PO CAPS: 300.0000 mg | ORAL_CAPSULE | Freq: Two times a day (BID) | ORAL | 0 refills | Status: AC

## 2021-01-14 MED ORDER — FLUTICASONE PROPIONATE 50 MCG/ACT NA SUSP: 1.0000 | Freq: Every day | NASAL | 0 refills | Status: AC

## 2021-01-14 NOTE — ED Provider Notes (Signed)
[facility]    CSN: [id number] Arrival date & time: 01/14/21  1143      History   Chief Complaint Chief Complaint  Patient presents with  . Otalgia    HPI Howard Hayes is a 25 y.o. male.   Howard Hayes presents with complaints of right ear pain. A month ago he first noted it, did a telemedicine visit and was prescribed amoxicillin which did seem to help some. He has a history of ear infections and felt like it was similar. The pain then returned over the past week and felt clogged, so he used an over the counter kit to remove wax, in which he successfully removed a large amount. This morning he woke with increased pain and swelling around right ear. No fevers. No throat pain. No cough or congestion.    ROS per HPI, negative if not otherwise mentioned.      Past Medical History:  Diagnosis Date  . Anxiety   . Heart murmur   . Heart valve problem    unsure  . Hypertension     Patient Active Problem List   Diagnosis Date Noted  . 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 LEG SURGERY         Home Medications    Prior to Admission medications   Medication Sig Start Date End Date Taking? Authorizing Provider  cefdinir (OMNICEF) 300 MG capsule Take 1 capsule (300 mg total) by mouth 2 (two) times daily for 7 days. 01/14/21 01/21/21 Yes Carlyn Lemke, Malachy Moan, NP  fluticasone (FLONASE) 50 MCG/ACT nasal spray Place 1 spray into both nostrils daily. 01/14/21  Yes Bita Cartwright, Lanelle Bal B, NP  neomycin-polymyxin-hydrocortisone (CORTISPORIN) 3.5-10000-1 OTIC suspension Place 3 drops into the right ear 3 (three) times daily for 7 days. 01/14/21 01/21/21 Yes Floy Riegler, Malachy Moan, NP  cyclobenzaprine (FLEXERIL) 5 MG tablet Take 1-2 tablets (5-10 mg total) by mouth 2 (two) times daily as needed for muscle spasms. Patient not taking: No sig reported 07/04/19   Wieters, Hallie C, PA-C  famotidine (PEPCID) 20 MG tablet Take 1 tablet (20 mg  total) by mouth 2 (two) times daily. Patient not taking: No sig reported 03/25/19   Dorie Rank, MD  hydroxypropyl methylcellulose / hypromellose (ISOPTO TEARS / GONIOVISC) 2.5 % ophthalmic solution Place 1 drop into the right eye as needed for dry eyes. 03/14/20   Darr, Edison Nasuti, PA-C  ibuprofen (ADVIL) 800 MG tablet Take 1 tablet (800 mg total) by mouth 3 (three) times daily. 03/14/20   Darr, Edison Nasuti, PA-C  lisinopril (ZESTRIL) 5 MG tablet Take 1 tablet (5 mg total) by mouth daily. 08/09/19   Geralynn Rile, MD  naproxen (NAPROSYN) 375 MG tablet Take 1 tablet (375 mg total) by mouth 2 (two) times daily. Patient not taking: No sig reported 07/15/18   Petrucelli, Samantha R, PA-C  ofloxacin (FLOXIN) 0.3 % OTIC solution Place 5 drops into both ears daily. Patient not taking: No sig reported 07/15/18   Petrucelli, Samantha R, PA-C  ondansetron (ZOFRAN ODT) 8 MG disintegrating tablet Take 1 tablet (8 mg total) by mouth every 8 (eight) hours as needed for nausea or vomiting. Patient not taking: No sig reported 03/25/19   Dorie Rank, MD  predniSONE (DELTASONE) 20 MG tablet Two daily with food Patient not taking: No sig reported 04/27/18   Robyn Haber, MD    Family History Family History  Problem Relation Age of Onset  . Heart disease Mother  Social History Social History   Tobacco Use  . Smoking status: Current Every Day Smoker    Packs/day: 1.50    Years: 11.00    Pack years: 16.50    Types: Cigarettes  . Smokeless tobacco: Never Used  Vaping Use  . Vaping Use: Never used  Substance Use Topics  . Alcohol use: Yes    Comment: occasionally   . Drug use: No     Allergies   Patient has no known allergies.   Review of Systems Review of Systems   Physical Exam Triage Vital Signs ED Triage Vitals  Enc Vitals Group     BP 01/14/21 1212 133/80     Pulse Rate 01/14/21 1212 80     Resp 01/14/21 1212 18     Temp 01/14/21 1212 97.7 F (36.5 C)     Temp Source 01/14/21 1212 Oral      SpO2 01/14/21 1212 96 %     Weight --      Height --      Head Circumference --      Peak Flow --      Pain Score 01/14/21 1210 9     Pain Loc --      Pain Edu? --      Excl. in [place]? --    No data found.  Updated Vital Signs BP 133/80 (BP Location: Right Arm)   Pulse 80   Temp 97.7 F (36.5 C) (Oral)   Resp 18   SpO2 96%   Visual Acuity Right Eye Distance:   Left Eye Distance:   Bilateral Distance:    Right Eye Near:   Left Eye Near:    Bilateral Near:     Physical Exam Constitutional:      Appearance: He is well-developed.  HENT:     Left Ear: Tympanic membrane, ear canal and external ear normal.     Ears:     Comments: Dull and opaque right tm with mildly tender canal with exam with small amount of yellow discharge within canal  Cardiovascular:     Rate and Rhythm: Normal rate.  Pulmonary:     Effort: Pulmonary effort is normal.  Skin:    General: Skin is warm and dry.  Neurological:     Mental Status: He is alert and oriented to person, place, and time.      UC Treatments / Results  Labs (all labs ordered are listed, but only abnormal results are displayed) Labs Reviewed - No data to display  EKG   Radiology No results found.  Procedures Procedures (including critical care time)  Medications Ordered in UC Medications - No data to display  Initial Impression / Assessment and Plan / UC Course  I have reviewed the triage vital signs and the nursing notes.  Pertinent labs & imaging results that were available during my care of the patient were reviewed by me and considered in my medical decision making (see chart for details).     Cefdinir for concern for recurrent right AOM. Had been frequently irrigating ear canal to remove cerumen, with some tenderness and discharge, cortisporin provided as well today. Return precautions provided. Patient verbalized understanding and agreeable to plan.   Final Clinical Impressions(s) / UC Diagnoses    Final diagnoses:  Right otitis media, unspecified otitis media type     Discharge Instructions     I have provided you with a different antibiotic as well as an ear drop I would like you  to complete.  Daily nasal spray for the next few weeks as well.  If symptoms worsen or do not improve in the next week to return to be seen or to follow up with your PCP.     ED Prescriptions    Medication Sig Dispense Auth. Provider   cefdinir (OMNICEF) 300 MG capsule Take 1 capsule (300 mg total) by mouth 2 (two) times daily for 7 days. 14 capsule Augusto Gamble B, NP   neomycin-polymyxin-hydrocortisone (CORTISPORIN) 3.5-10000-1 OTIC suspension Place 3 drops into the right ear 3 (three) times daily for 7 days. 10 mL Augusto Gamble B, NP   fluticasone (FLONASE) 50 MCG/ACT nasal spray Place 1 spray into both nostrils daily. 16 g Zigmund Gottron, NP     PDMP not reviewed this encounter.   Zigmund Gottron, NP 01/14/21 1240

## 2021-01-14 NOTE — ED Triage Notes (Signed)
Pt reports  right ear pain and clogged sensation x 1 month.States he removed a lot of wax with ear wax removal kit.

## 2021-01-14 NOTE — Discharge Instructions (Signed)
I have provided you with a different antibiotic as well as an ear drop I would like you to complete.  Daily nasal spray for the next few weeks as well.  If symptoms worsen or do not improve in the next week to return to be seen or to follow up with your PCP.
